# Patient Record
Sex: Female | Born: 1960 | Marital: Married | Smoking: Never smoker
Health system: Southern US, Community
[De-identification: ages and names within clinical notes are randomized; demographics above are authoritative.]

## PROBLEM LIST (undated history)

## (undated) DIAGNOSIS — T8859XA Other complications of anesthesia, initial encounter: Secondary | ICD-10-CM

## (undated) DIAGNOSIS — K219 Gastro-esophageal reflux disease without esophagitis: Secondary | ICD-10-CM

## (undated) DIAGNOSIS — T4145XA Adverse effect of unspecified anesthetic, initial encounter: Secondary | ICD-10-CM

## (undated) DIAGNOSIS — Z87442 Personal history of urinary calculi: Secondary | ICD-10-CM

## (undated) DIAGNOSIS — G4730 Sleep apnea, unspecified: Secondary | ICD-10-CM

---
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
## 1989-09-16 HISTORY — PX: TUBAL LIGATION: SHX77

## 2015-07-06 ENCOUNTER — Other Ambulatory Visit: Payer: Self-pay | Admitting: Primary Care

## 2015-07-06 ENCOUNTER — Ambulatory Visit
Admission: RE | Admit: 2015-07-06 | Discharge: 2015-07-06 | Disposition: A | Discharge status: Home / Self Care | Payer: BLUE CROSS/BLUE SHIELD | Source: Ambulatory Visit | Attending: Primary Care | Admitting: Primary Care

## 2015-07-06 DIAGNOSIS — Z1231 Encounter for screening mammogram for malignant neoplasm of breast: Secondary | ICD-10-CM | POA: Diagnosis not present

## 2016-07-11 ENCOUNTER — Other Ambulatory Visit: Payer: Self-pay | Admitting: Primary Care

## 2016-07-11 DIAGNOSIS — R102 Pelvic and perineal pain: Secondary | ICD-10-CM

## 2016-07-11 DIAGNOSIS — Z0000 Encounter for general adult medical examination without abnormal findings: Secondary | ICD-10-CM

## 2016-07-16 ENCOUNTER — Ambulatory Visit: Payer: BLUE CROSS/BLUE SHIELD

## 2016-07-26 ENCOUNTER — Ambulatory Visit
Admission: RE | Admit: 2016-07-26 | Discharge: 2016-07-26 | Disposition: A | Discharge status: Home / Self Care | Payer: BLUE CROSS/BLUE SHIELD | Source: Ambulatory Visit | Attending: Primary Care | Admitting: Primary Care

## 2016-07-26 DIAGNOSIS — Z780 Asymptomatic menopausal state: Secondary | ICD-10-CM | POA: Diagnosis not present

## 2016-07-26 DIAGNOSIS — R102 Pelvic and perineal pain: Secondary | ICD-10-CM | POA: Insufficient documentation

## 2016-07-26 DIAGNOSIS — Z9851 Tubal ligation status: Secondary | ICD-10-CM | POA: Diagnosis not present

## 2016-08-22 ENCOUNTER — Ambulatory Visit
Admission: RE | Admit: 2016-08-22 | Discharge: 2016-08-22 | Disposition: A | Discharge status: Home / Self Care | Payer: BLUE CROSS/BLUE SHIELD | Source: Ambulatory Visit | Attending: Primary Care | Admitting: Primary Care

## 2016-08-22 DIAGNOSIS — Z1231 Encounter for screening mammogram for malignant neoplasm of breast: Secondary | ICD-10-CM | POA: Insufficient documentation

## 2016-08-22 DIAGNOSIS — Z0000 Encounter for general adult medical examination without abnormal findings: Secondary | ICD-10-CM

## 2019-03-15 ENCOUNTER — Telehealth: Payer: Self-pay | Admitting: *Deleted

## 2019-03-15 DIAGNOSIS — Z20822 Contact with and (suspected) exposure to covid-19: Secondary | ICD-10-CM

## 2019-03-15 NOTE — Telephone Encounter (Signed)
Called pt using interpreter # [phone] to scheduled for covid-19 testing. She is scheduled for tomorrow at the [facility] building in [place] at 11 am. Advised to stay in car with mask on and windows rolled up until time to be tested. Advised that this is a drive thru testing site. She voiced understanding.

## 2019-03-16 ENCOUNTER — Other Ambulatory Visit: Payer: BLUE CROSS/BLUE SHIELD

## 2019-03-16 DIAGNOSIS — Z20822 Contact with and (suspected) exposure to covid-19: Secondary | ICD-10-CM

## 2019-03-20 LAB — NOVEL CORONAVIRUS, NAA: SARS-CoV-2, NAA: NOT DETECTED

## 2019-10-27 ENCOUNTER — Other Ambulatory Visit: Payer: Self-pay

## 2019-10-27 ENCOUNTER — Emergency Department
Admission: EM | Admit: 2019-10-27 | Discharge: 2019-10-27 | Disposition: A | Discharge status: Home / Self Care | Payer: BC Managed Care – PPO | Attending: Emergency Medicine | Admitting: Emergency Medicine

## 2019-10-27 ENCOUNTER — Emergency Department: Payer: BC Managed Care – PPO

## 2019-10-27 DIAGNOSIS — Y939 Activity, unspecified: Secondary | ICD-10-CM | POA: Insufficient documentation

## 2019-10-27 DIAGNOSIS — S59911A Unspecified injury of right forearm, initial encounter: Secondary | ICD-10-CM | POA: Diagnosis present

## 2019-10-27 DIAGNOSIS — S52601A Unspecified fracture of lower end of right ulna, initial encounter for closed fracture: Secondary | ICD-10-CM

## 2019-10-27 DIAGNOSIS — S52611A Displaced fracture of right ulna styloid process, initial encounter for closed fracture: Secondary | ICD-10-CM | POA: Insufficient documentation

## 2019-10-27 DIAGNOSIS — W010XXA Fall on same level from slipping, tripping and stumbling without subsequent striking against object, initial encounter: Secondary | ICD-10-CM | POA: Insufficient documentation

## 2019-10-27 DIAGNOSIS — Y999 Unspecified external cause status: Secondary | ICD-10-CM | POA: Diagnosis not present

## 2019-10-27 DIAGNOSIS — S52591A Other fractures of lower end of right radius, initial encounter for closed fracture: Secondary | ICD-10-CM | POA: Diagnosis not present

## 2019-10-27 DIAGNOSIS — Y92000 Kitchen of unspecified non-institutional (private) residence as  the place of occurrence of the external cause: Secondary | ICD-10-CM | POA: Diagnosis not present

## 2019-10-27 DIAGNOSIS — S52501A Unspecified fracture of the lower end of right radius, initial encounter for closed fracture: Secondary | ICD-10-CM

## 2019-10-27 MED ORDER — HYDROMORPHONE HCL 1 MG/ML IJ SOLN
INTRAMUSCULAR | Status: AC
  Administered 2019-10-27: 1 mg via INTRAVENOUS
  Filled 2019-10-27: qty 1

## 2019-10-27 MED ORDER — ONDANSETRON HCL 4 MG/2ML IJ SOLN
4.0000 mg | Freq: Once | INTRAMUSCULAR | Status: DC
  Filled 2019-10-27: qty 2

## 2019-10-27 MED ORDER — OXYCODONE-ACETAMINOPHEN 5-325 MG PO TABS: 1.0000 | ORAL_TABLET | ORAL | 0 refills | Status: DC | PRN

## 2019-10-27 MED ORDER — HYDROMORPHONE HCL 1 MG/ML IJ SOLN: 1.0000 mg | Freq: Once | INTRAMUSCULAR | Status: AC

## 2019-10-27 MED ORDER — OXYCODONE-ACETAMINOPHEN 5-325 MG PO TABS
1.0000 | ORAL_TABLET | Freq: Once | ORAL | Status: AC
  Administered 2019-10-27: 17:00:00 1 via ORAL
  Filled 2019-10-27: qty 1

## 2019-10-27 MED ORDER — MORPHINE SULFATE (PF) 4 MG/ML IV SOLN: 4.0000 mg | Freq: Once | INTRAVENOUS | Status: DC

## 2019-10-27 NOTE — ED Notes (Signed)
Esignature pad not working at time of discharge; pt expresses verbal understanding of paperwork with no further questions at this time.

## 2019-10-27 NOTE — ED Provider Notes (Signed)
   PROCEDURES  Procedure(s) performed (including Critical Care):  .Ortho Injury Treatment  Date/Time: 10/27/2019 5:37 PM Performed by: Chesley Noon, MD Authorized by: Chesley Noon, MD   Consent:    Consent obtained:  Verbal   Consent given by:  PatientInjury location: wrist Location details: right wrist Injury type: fracture Fracture type: distal radius and ulnar styloid Pre-procedure neurovascular assessment: neurovascularly intact Pre-procedure distal perfusion: normal Pre-procedure neurological function: normal Pre-procedure range of motion: reduced  Anesthesia: Local anesthesia used: no  Patient sedated: NoManipulation performed: yes Skeletal traction used: yes Reduction successful: yes X-ray confirmed reduction: yes Immobilization: splint Splint type: sugar tong Supplies used: cotton padding and Ortho-Glass Post-procedure neurovascular assessment: post-procedure neurovascularly intact Post-procedure distal perfusion: normal Post-procedure neurological function: normal Post-procedure range of motion: unchanged Patient tolerance: patient tolerated the procedure well with no immediate complications       Chesley Noon, MD 10/27/19 1836

## 2019-10-27 NOTE — ED Triage Notes (Addendum)
Pt states she slipped on something at home to day and injured her right wrist. Denies other injury

## 2019-10-27 NOTE — Discharge Instructions (Signed)
Follow-up with Dr. Allena Katz.  Please call for an appointment.   Elevate and ice the wrist.   Do not remove the splint until you have been evaluated by orthopedics.   take the pain medication as needed.  You can also take ibuprofen as needed. Return to the emergency department if worsening

## 2019-10-27 NOTE — ED Provider Notes (Signed)
[facility] Emergency Department Provider Note  ____________________________________________   First MD Initiated Contact with Patient 10/27/19 1551     (approximate)  I have reviewed the triage vital signs and the nursing notes.   HISTORY  Chief Complaint Wrist Pain    HPI Veronica Anthony is a 59 y.o. female presents emergency department with complaints of right wrist pain.  Patient states that she tripped and fell in her kitchen.  No other injuries reported.    History reviewed. No pertinent past medical history.  There are no problems to display for this patient.   History reviewed. No pertinent surgical history.  Prior to Admission medications   Medication Sig Start Date End Date Taking? Authorizing Provider  oxyCODONE-acetaminophen (PERCOCET) 5-325 MG tablet Take 1 tablet by mouth every 4 (four) hours as needed for severe pain. 10/27/19 10/26/20  Faythe Ghee, PA-C    Allergies Tramadol  Family History  Problem Relation Age of Onset  . Breast cancer Neg Hx     Social History Social History   Tobacco Use  . Smoking status: Never Smoker  . Smokeless tobacco: Never Used  Substance Use Topics  . Alcohol use: Not Currently  . Drug use: Not Currently    Review of Systems  Constitutional: No fever/chills Eyes: No visual changes. ENT: No sore throat. Respiratory: Denies cough Cardiovascular: Denies chest pain Gastrointestinal: Denies abdominal pain Genitourinary: Negative for dysuria. Musculoskeletal: Negative for back pain.  Positive for right wrist pain Skin: Negative for rash. Psychiatric: no mood changes,     ____________________________________________   PHYSICAL EXAM:  VITAL SIGNS: ED Triage Vitals  Enc Vitals Group     BP 10/27/19 1540 (!) 124/53     Pulse Rate 10/27/19 1540 74     Resp 10/27/19 1540 18     Temp 10/27/19 1540 97.9 F (36.6 C)     Temp Source 10/27/19 1540 Oral     SpO2 10/27/19 1540 97 %       Weight 10/27/19 1536 150 lb (68 kg)     Height 10/27/19 1536 5\' 6"  (1.676 m)     Head Circumference --      Peak Flow --      Pain Score 10/27/19 1536 10     Pain Loc --      Pain Edu? --      Excl. in GC? --     Constitutional: Alert and oriented. Well appearing and in no acute distress. Eyes: Conjunctivae are normal.  Head: Atraumatic. Nose: No congestion/rhinnorhea. Mouth/Throat: Mucous membranes are moist.   Neck:  supple no lymphadenopathy noted Cardiovascular: Normal rate, regular rhythm. Respiratory: Normal respiratory effort.  No retractions,  GU: deferred Musculoskeletal: Decreased range of motion of the right wrist, positive deformity noted distally, neurovascular is intact  neurologic:  Normal speech and language.  Skin:  Skin is warm, dry and intact. No rash noted. Psychiatric: Mood and affect are normal. Speech and behavior are normal.  ____________________________________________   LABS (all labs ordered are listed, but only abnormal results are displayed)  Labs Reviewed - No data to display ____________________________________________   ____________________________________________  RADIOLOGY  X-ray of the right wrist shows a distal radial fracture along with a small fracture of the distal ulna  ____________________________________________   PROCEDURES  Procedure(s) performed: Reduction performed by Dr. 12/25/19, sugar tong OCL applied, sling applied  Procedures    ____________________________________________   INITIAL IMPRESSION / ASSESSMENT AND PLAN / ED COURSE  Pertinent labs &  imaging results that were available during my care of the patient were reviewed by me and considered in my medical decision making (see chart for details).   The patient is a 59 year old female presents emergency department after a fall in her kitchen.  Complaining of right wrist pain.  Physical exam shows patient to appear well.  Positive deformity noted at the  distal radius.  Neurovascular is intact.  Remainder of exam is unremarkable  X-ray of the right wrist shows a distal radial fracture which is angulated and displaced, distal ulna has a small fracture noted. X-ray of the forearm shows the same fracture no proximal fracture noted  Dr. Posey Pronto was notified.  He states to reduce and splint.  Follow-up in his office.  Explained findings to the patient.  I did place the arm on a pillow with towels to stabilize it, ice pack was applied.  Percocet 1 p.o. was ordered.  Dilaudid 1 mg IV, Zofran 4 mg IV prior to reduction.  Dr. Charna Archer in to reduce the fracture.  She was placed in a sugar tong OCL after reduction..  She is neurovascularly intact post splint application.  All instructions were discussed with the patient via the interpreter.  She was discharged in stable condition.   Veronica Anthony was evaluated in Emergency Department on 10/27/2019 for the symptoms described in the history of present illness. She was evaluated in the context of the global COVID-19 pandemic, which necessitated consideration that the patient might be at risk for infection with the SARS-CoV-2 virus that causes COVID-19. Institutional protocols and algorithms that pertain to the evaluation of patients at risk for COVID-19 are in a state of rapid change based on information released by regulatory bodies including the CDC and federal and state organizations. These policies and algorithms were followed during the patient's care in the ED.   As part of my medical decision making, I reviewed the following data within the [place] notes reviewed and incorporated, Interpreter needed, Old chart reviewed, Radiograph reviewed see above, A consult was requested and obtained from this/these consultant(s) Orthopedics, Evaluated by EM attending Dr. Charna Archer, Notes from prior ED visits and [place] Controlled Substance  Database  ____________________________________________   FINAL CLINICAL IMPRESSION(S) / ED DIAGNOSES  Final diagnoses:  Closed fracture of distal end of right radius, unspecified fracture morphology, initial encounter  Closed fracture of distal end of right ulna, unspecified fracture morphology, initial encounter      NEW MEDICATIONS STARTED DURING THIS VISIT:  New Prescriptions   OXYCODONE-ACETAMINOPHEN (PERCOCET) 5-325 MG TABLET    Take 1 tablet by mouth every 4 (four) hours as needed for severe pain.     Note:  This document was prepared using Dragon voice recognition software and may include unintentional dictation errors.    Versie Starks, PA-C 10/27/19 1746    Blake Divine, MD 10/27/19 Bosie Helper

## 2019-11-02 ENCOUNTER — Encounter
Admission: RE | Admit: 2019-11-02 | Discharge: 2019-11-02 | Disposition: A | Discharge status: Home / Self Care | Payer: BC Managed Care – PPO | Source: Ambulatory Visit | Attending: Orthopedic Surgery | Admitting: Orthopedic Surgery

## 2019-11-02 ENCOUNTER — Other Ambulatory Visit: Payer: Self-pay | Admitting: Orthopedic Surgery

## 2019-11-02 DIAGNOSIS — Z01818 Encounter for other preprocedural examination: Secondary | ICD-10-CM | POA: Diagnosis not present

## 2019-11-02 HISTORY — DX: Gastro-esophageal reflux disease without esophagitis: K21.9

## 2019-11-02 HISTORY — DX: Sleep apnea, unspecified: G47.30

## 2019-11-02 HISTORY — DX: Other complications of anesthesia, initial encounter: T88.59XA

## 2019-11-02 HISTORY — DX: Personal history of urinary calculi: Z87.442

## 2019-11-02 NOTE — Patient Instructions (Addendum)
Your procedure is scheduled on: Thursday 11/04/19 Su procedimiento est programado para: Veronica Anthony 11/04/19 Report to [place] [facility]: [facility]  To find out your arrival time please call [phone] between 1PM - 3PM on. Wednesday 11/03/19 Para saber su hora de llegada por favor llame al [phone] entre la 1PM - 3PM el da: M[place]col[place] 11/03/19   Remember: Instructions that are not followed completely may result in serious medical risk, up to and including death,  or upon the discretion of your surgeon and anesthesiologist your surgery may need to be rescheduled.  Recuerde: Las instrucciones que no se siguen completamente Heritage manager en un riesgo de salud grave, incluyendo hasta  la [place] o a discrecin de su cirujano y Environmental health practitioner, su ciruga se puede posponer.   __X_ 1.Do not eat food after midnight the night before your procedure. No    gum chewing or hard candies. You may drink clear liquids up to 2 hours     before you are scheduled to arrive for your surgery- DO not drink clear     Liquids within 2 hours of the start of your surgery.     Clear Liquids include:    water, apple juice without pulp, clear carbohydrate drink such as    Clearfast of [place], Black Coffee or Tea (Do not add anything to coffee or tea).     __X__ No coma nada despus de la medianoche de la noche anterior a su    procedimiento. No coma chicles ni caramelos duros. Puede tomar    lquidos claros hasta 2 horas antes de su hora programada de llegada al     hospital para su procedimiento. No tome lquidos claros durante el     transcurso de las 2 horas de su llegada programada al hospital para su     procedimiento, ya que esto puede llevar a que su procedimiento se    retrase o tenga que volver a Health and safety inspector.  Los lquidos claros incluyen:          - Agua o jugo de [place] sin pulpa          - Bebidas claras con carbohidratos como ClearFast o Gatorade          - Caf negro o t claro (sin  leche, sin cremas, no agregue nada al caf ni al t)  No tome nada que no est en esta lista.  Los pacientes con diabetes tipo 1 y tipo 2 solo deben Agricultural engineer.  Llame a la clnica de PreCare o a la unidad de Same Day Surgery si  tiene alguna pregunta sobre estas instrucciones.              ___ 2.Do Not Smoke or use e-cigarettes For 24 Hours Prior to Your Surgery.    Do not use any chewable tobacco products for at least 6   hours prior to surgery.    No fume ni use cigarrillos electrnicos durante las 24 horas previas    a su [place].  No use ningn producto de tabaco masticable durante   al menos 6 horas antes de la ciruga.     ___ 3. No alcohol for 24 hours before or after surgery.    No tome alcohol durante las 24 horas antes ni despus de la [place].    __x__ 5. Notify your doctor if there is any change in your medical condition (cold,fever, infections).    Informe a su mdico si hay algn cambio en su  condicin mdica  (resfriado, fiebre, infecciones).   Do not wear jewelry, make-up, hairpins, clips or nail polish.  No use joyas, maquillajes, pinzas/ganchos para el cabello ni esmalte de uas.  Do not wear lotions, powders, or perfumes. You may wear deodorant.  No use lociones, polvos o perfumes.  Puede usar desodorante.    Do not shave 48 hours prior to surgery. Men may shave face and neck.  No se afeite 48 horas antes de la [place].  Los hombres pueden [employer] cara  y el cuello.   Do not bring valuables to the hospital.   No lleve objetos de valor al hospital.  [facility] is not responsible for any belongings or valuables.  [facility] no se hace responsable de ningn tipo de pertenencias u objetos de Licensed conveyancer.               Contacts, dentures or bridgework may not be worn into surgery.  Los lentes de [place], las dentaduras postizas o puentes no se pueden usar en la [place].     Patients discharged the day of surgery will not be allowed to drive home. A los  pacientes que se les da de alta el mismo da de la ciruga no se les permitir conducir a Higher education careers adviser.      __x__ Take these medicines the morning of surgery with A SIP OF WATER:           Tome estas medicinas la maana de la ciruga con UN SORBO DE AGUA:  1.esomeprazole (NEXIUM)       __x__ Use CHG Soap as directed          Utilice el jabn de CHG segn lo indicado la noche antes y el dia de la [place]      __x__ Stop Anti-inflammatories today ibuprofen (ADVIL)           Deje de tomar antiinflamatorios el da: hoy ibuprofen (ADVIL)    __x__ Stop supplements until after surgery            Deje de tomar suplementos hasta despus de la ciruga.    __x__ Do not start new supplements before your surgery            No empieze suplementos nuevos antes de [place]

## 2019-11-03 ENCOUNTER — Other Ambulatory Visit: Payer: Self-pay

## 2019-11-03 ENCOUNTER — Other Ambulatory Visit
Admission: RE | Admit: 2019-11-03 | Discharge: 2019-11-03 | Disposition: A | Discharge status: Home / Self Care | Payer: BC Managed Care – PPO | Source: Ambulatory Visit | Attending: Orthopedic Surgery | Admitting: Orthopedic Surgery

## 2019-11-03 DIAGNOSIS — Z20822 Contact with and (suspected) exposure to covid-19: Secondary | ICD-10-CM | POA: Insufficient documentation

## 2019-11-03 DIAGNOSIS — Z01812 Encounter for preprocedural laboratory examination: Secondary | ICD-10-CM | POA: Insufficient documentation

## 2019-11-03 LAB — SARS CORONAVIRUS 2 (TAT 6-24 HRS): SARS Coronavirus 2: NEGATIVE

## 2019-11-04 ENCOUNTER — Encounter: Payer: Self-pay | Admitting: Orthopedic Surgery

## 2019-11-04 ENCOUNTER — Ambulatory Visit: Payer: BC Managed Care – PPO

## 2019-11-04 ENCOUNTER — Ambulatory Visit
Admission: RE | Admit: 2019-11-04 | Discharge: 2019-11-04 | Disposition: A | Discharge status: Home / Self Care | Payer: BC Managed Care – PPO | Attending: Orthopedic Surgery | Admitting: Orthopedic Surgery

## 2019-11-04 ENCOUNTER — Encounter
Admission: RE | Disposition: A | Discharge status: Home / Self Care | Payer: Self-pay | Source: Home / Self Care | Attending: Orthopedic Surgery

## 2019-11-04 ENCOUNTER — Ambulatory Visit: Payer: BC Managed Care – PPO | Admitting: Anesthesiology

## 2019-11-04 ENCOUNTER — Other Ambulatory Visit: Payer: Self-pay

## 2019-11-04 DIAGNOSIS — K219 Gastro-esophageal reflux disease without esophagitis: Secondary | ICD-10-CM | POA: Insufficient documentation

## 2019-11-04 DIAGNOSIS — Z8781 Personal history of (healed) traumatic fracture: Secondary | ICD-10-CM

## 2019-11-04 DIAGNOSIS — X58XXXD Exposure to other specified factors, subsequent encounter: Secondary | ICD-10-CM | POA: Diagnosis not present

## 2019-11-04 DIAGNOSIS — S52571D Other intraarticular fracture of lower end of right radius, subsequent encounter for closed fracture with routine healing: Secondary | ICD-10-CM | POA: Insufficient documentation

## 2019-11-04 DIAGNOSIS — Z9889 Other specified postprocedural states: Secondary | ICD-10-CM

## 2019-11-04 HISTORY — PX: OPEN REDUCTION INTERNAL FIXATION (ORIF) DISTAL RADIAL FRACTURE: SHX5989

## 2019-11-04 SURGERY — OPEN REDUCTION INTERNAL FIXATION (ORIF) DISTAL RADIUS FRACTURE
Anesthesia: General | Laterality: Right

## 2019-11-04 MED ORDER — FENTANYL CITRATE (PF) 100 MCG/2ML IJ SOLN
INTRAMUSCULAR | Status: AC
  Administered 2019-11-04: 15:00:00 25 ug via INTRAVENOUS
  Filled 2019-11-04: qty 2

## 2019-11-04 MED ORDER — FAMOTIDINE 20 MG PO TABS: 20.0000 mg | ORAL_TABLET | Freq: Once | ORAL | Status: AC

## 2019-11-04 MED ORDER — FENTANYL CITRATE (PF) 100 MCG/2ML IJ SOLN
25.0000 ug | INTRAMUSCULAR | Status: DC | PRN
  Administered 2019-11-04 (×4): 25 ug via INTRAVENOUS

## 2019-11-04 MED ORDER — FENTANYL CITRATE (PF) 100 MCG/2ML IJ SOLN
INTRAMUSCULAR | Status: DC | PRN
  Administered 2019-11-04 (×2): 50 ug via INTRAVENOUS

## 2019-11-04 MED ORDER — HYDROMORPHONE HCL 1 MG/ML IJ SOLN
INTRAMUSCULAR | Status: AC
  Administered 2019-11-04: 0.5 mg via INTRAVENOUS
  Filled 2019-11-04: qty 1

## 2019-11-04 MED ORDER — MIDAZOLAM HCL 2 MG/2ML IJ SOLN
INTRAMUSCULAR | Status: DC | PRN
  Administered 2019-11-04: 2 mg via INTRAVENOUS

## 2019-11-04 MED ORDER — METOCLOPRAMIDE HCL 5 MG/ML IJ SOLN: 5.0000 mg | Freq: Three times a day (TID) | INTRAMUSCULAR | Status: DC | PRN

## 2019-11-04 MED ORDER — CEFAZOLIN SODIUM-DEXTROSE 2-4 GM/100ML-% IV SOLN
INTRAVENOUS | Status: AC
  Filled 2019-11-04: qty 100

## 2019-11-04 MED ORDER — NEOMYCIN-POLYMYXIN B GU 40-200000 IR SOLN
Status: AC
  Filled 2019-11-04: qty 20

## 2019-11-04 MED ORDER — MIDAZOLAM HCL 2 MG/2ML IJ SOLN
INTRAMUSCULAR | Status: AC
  Filled 2019-11-04: qty 2

## 2019-11-04 MED ORDER — ONDANSETRON HCL 4 MG/2ML IJ SOLN: 4.0000 mg | Freq: Four times a day (QID) | INTRAMUSCULAR | Status: DC | PRN

## 2019-11-04 MED ORDER — NEOMYCIN-POLYMYXIN B GU 40-200000 IR SOLN
Status: DC | PRN
  Administered 2019-11-04: 4 mL

## 2019-11-04 MED ORDER — FENTANYL CITRATE (PF) 100 MCG/2ML IJ SOLN
INTRAMUSCULAR | Status: AC
  Filled 2019-11-04: qty 2

## 2019-11-04 MED ORDER — CHLORHEXIDINE GLUCONATE 4 % EX LIQD: 60.0000 mL | Freq: Once | CUTANEOUS | Status: DC

## 2019-11-04 MED ORDER — FAMOTIDINE 20 MG PO TABS
ORAL_TABLET | ORAL | Status: AC
  Administered 2019-11-04: 20 mg via ORAL
  Filled 2019-11-04: qty 1

## 2019-11-04 MED ORDER — PHENYLEPHRINE HCL (PRESSORS) 10 MG/ML IV SOLN
INTRAVENOUS | Status: DC | PRN
  Administered 2019-11-04: 100 ug via INTRAVENOUS

## 2019-11-04 MED ORDER — LIDOCAINE HCL (CARDIAC) PF 100 MG/5ML IV SOSY
PREFILLED_SYRINGE | INTRAVENOUS | Status: DC | PRN
  Administered 2019-11-04: 100 mg via INTRAVENOUS

## 2019-11-04 MED ORDER — KETOROLAC TROMETHAMINE 30 MG/ML IJ SOLN: 30.0000 mg | Freq: Once | INTRAMUSCULAR | Status: AC

## 2019-11-04 MED ORDER — SCOPOLAMINE 1 MG/3DAYS TD PT72
MEDICATED_PATCH | TRANSDERMAL | Status: AC
  Administered 2019-11-04: 13:00:00 1.5 mg via TRANSDERMAL
  Filled 2019-11-04: qty 1

## 2019-11-04 MED ORDER — ONDANSETRON HCL 4 MG/2ML IJ SOLN: 4.0000 mg | Freq: Once | INTRAMUSCULAR | Status: DC | PRN

## 2019-11-04 MED ORDER — CEFAZOLIN SODIUM-DEXTROSE 2-4 GM/100ML-% IV SOLN
2.0000 g | INTRAVENOUS | Status: AC
  Administered 2019-11-04: 2 g via INTRAVENOUS

## 2019-11-04 MED ORDER — LACTATED RINGERS IV SOLN: INTRAVENOUS | Status: DC

## 2019-11-04 MED ORDER — ACETAMINOPHEN 10 MG/ML IV SOLN: 1000.0000 mg | Freq: Once | INTRAVENOUS | Status: AC

## 2019-11-04 MED ORDER — SCOPOLAMINE 1 MG/3DAYS TD PT72: 1.0000 | MEDICATED_PATCH | TRANSDERMAL | Status: DC

## 2019-11-04 MED ORDER — ONDANSETRON HCL 4 MG/2ML IJ SOLN
INTRAMUSCULAR | Status: DC | PRN
  Administered 2019-11-04: 4 mg via INTRAVENOUS

## 2019-11-04 MED ORDER — PROPOFOL 10 MG/ML IV BOLUS
INTRAVENOUS | Status: DC | PRN
  Administered 2019-11-04: 130 mg via INTRAVENOUS

## 2019-11-04 MED ORDER — HYDROCODONE-ACETAMINOPHEN 5-325 MG PO TABS
ORAL_TABLET | ORAL | Status: AC
  Administered 2019-11-04: 1
  Filled 2019-11-04: qty 1

## 2019-11-04 MED ORDER — HYDROCODONE-ACETAMINOPHEN 5-325 MG PO TABS: 1.0000 | ORAL_TABLET | Freq: Four times a day (QID) | ORAL | 0 refills | Status: DC | PRN

## 2019-11-04 MED ORDER — ONDANSETRON HCL 4 MG PO TABS: 4.0000 mg | ORAL_TABLET | Freq: Four times a day (QID) | ORAL | Status: DC | PRN

## 2019-11-04 MED ORDER — METOCLOPRAMIDE HCL 10 MG PO TABS: 5.0000 mg | ORAL_TABLET | Freq: Three times a day (TID) | ORAL | Status: DC | PRN

## 2019-11-04 MED ORDER — HYDROMORPHONE HCL 1 MG/ML IJ SOLN
0.5000 mg | INTRAMUSCULAR | Status: DC | PRN
  Administered 2019-11-04: 0.5 mg via INTRAVENOUS

## 2019-11-04 MED ORDER — KETOROLAC TROMETHAMINE 30 MG/ML IJ SOLN
INTRAMUSCULAR | Status: AC
  Administered 2019-11-04: 15:00:00 30 mg via INTRAVENOUS
  Filled 2019-11-04: qty 1

## 2019-11-04 MED ORDER — ACETAMINOPHEN 10 MG/ML IV SOLN
INTRAVENOUS | Status: AC
  Administered 2019-11-04: 1000 mg via INTRAVENOUS
  Filled 2019-11-04: qty 100

## 2019-11-04 MED ORDER — DEXAMETHASONE SODIUM PHOSPHATE 10 MG/ML IJ SOLN
INTRAMUSCULAR | Status: DC | PRN
  Administered 2019-11-04: 10 mg via INTRAVENOUS

## 2019-11-04 MED ORDER — SODIUM CHLORIDE 0.9 % IV SOLN: INTRAVENOUS | Status: DC

## 2019-11-04 MED ORDER — HYDROCODONE-ACETAMINOPHEN 7.5-325 MG PO TABS
1.0000 | ORAL_TABLET | Freq: Once | ORAL | Status: DC
  Filled 2019-11-04: qty 1

## 2019-11-04 MED ORDER — FENTANYL CITRATE (PF) 100 MCG/2ML IJ SOLN
INTRAMUSCULAR | Status: AC
  Administered 2019-11-04: 25 ug via INTRAVENOUS
  Filled 2019-11-04: qty 2

## 2019-11-04 SURGICAL SUPPLY — 37 items
BNDG ELASTIC 4X5.8 VLCR STR LF (GAUZE/BANDAGES/DRESSINGS) ×3 IMPLANT
CANISTER SUCT 1200ML W/VALVE (MISCELLANEOUS) ×3 IMPLANT
CHLORAPREP W/TINT 26 (MISCELLANEOUS) ×3 IMPLANT
COVER WAND RF STERILE (DRAPES) ×3 IMPLANT
CUFF TOURN SGL QUICK 18X4 (TOURNIQUET CUFF) ×2 IMPLANT
DRAPE FLUOR MINI C-ARM 54X84 (DRAPES) ×3 IMPLANT
ELECT REM PT RETURN 9FT ADLT (ELECTROSURGICAL) ×3
ELECTRODE REM PT RTRN 9FT ADLT (ELECTROSURGICAL) ×1 IMPLANT
GAUZE SPONGE 4X4 12PLY STRL (GAUZE/BANDAGES/DRESSINGS) ×3 IMPLANT
GAUZE XEROFORM 1X8 LF (GAUZE/BANDAGES/DRESSINGS) ×6 IMPLANT
GLOVE SURG SYN 9.0  PF PI (GLOVE) ×2
GLOVE SURG SYN 9.0 PF PI (GLOVE) ×1 IMPLANT
GOWN SRG 2XL LVL 4 RGLN SLV (GOWNS) ×1 IMPLANT
GOWN STRL NON-REIN 2XL LVL4 (GOWNS) ×2
GOWN STRL REUS W/ TWL LRG LVL3 (GOWN DISPOSABLE) ×1 IMPLANT
GOWN STRL REUS W/TWL LRG LVL3 (GOWN DISPOSABLE) ×2
KIT TURNOVER KIT A (KITS) ×3 IMPLANT
NDL FILTER BLUNT 18X1 1/2 (NEEDLE) ×1 IMPLANT
NEEDLE FILTER BLUNT 18X 1/2SAF (NEEDLE) ×2
NEEDLE FILTER BLUNT 18X1 1/2 (NEEDLE) ×1 IMPLANT
NS IRRIG 500ML POUR BTL (IV SOLUTION) ×3 IMPLANT
PACK EXTREMITY ARMC (MISCELLANEOUS) ×3 IMPLANT
PAD CAST CTTN 4X4 STRL (SOFTGOODS) ×2 IMPLANT
PADDING CAST COTTON 4X4 STRL (SOFTGOODS) ×4
PEG SUBCHONDRAL SMOOTH 2.0X14 (Peg) ×2 IMPLANT
PEG SUBCHONDRAL SMOOTH 2.0X16 (Peg) ×4 IMPLANT
PEG SUBCHONDRAL SMOOTH 2.0X18 (Peg) ×4 IMPLANT
PEG SUBCHONDRAL SMOOTH 2.0X20 (Peg) ×2 IMPLANT
PLATE SHORT 21.6X48.9 NRRW RT (Plate) ×2 IMPLANT
SCALPEL PROTECTED #15 DISP (BLADE) ×6 IMPLANT
SCREW CORT 3.5X10 LNG (Screw) ×6 IMPLANT
SPLINT CAST 1 STEP 3X12 (MISCELLANEOUS) ×3 IMPLANT
SUT ETHILON 4-0 (SUTURE) ×2
SUT ETHILON 4-0 FS2 18XMFL BLK (SUTURE) ×1
SUT VICRYL 3-0 27IN (SUTURE) ×3 IMPLANT
SUTURE ETHLN 4-0 FS2 18XMF BLK (SUTURE) ×1 IMPLANT
SYR 3ML LL SCALE MARK (SYRINGE) ×3 IMPLANT

## 2019-11-04 NOTE — Anesthesia Preprocedure Evaluation (Signed)
Anesthesia Evaluation  Patient identified by MRN, date of birth, ID band Patient awake    Reviewed: Allergy & Precautions, NPO status , Patient's Chart, lab work & pertinent test results  History of Anesthesia Complications (+) PONV and history of anesthetic complications  Airway Mallampati: II       Dental   Pulmonary neg sleep apnea, neg COPD, Not current smoker,           Cardiovascular (-) hypertension(-) Past MI and (-) CHF (-) dysrhythmias (-) Valvular Problems/Murmurs     Neuro/Psych neg Seizures    GI/Hepatic Neg liver ROS, GERD  ,  Endo/Other  neg diabetes  Renal/GU negative Renal ROS     Musculoskeletal   Abdominal   Peds  Hematology   Anesthesia Other Findings   Reproductive/Obstetrics                             Anesthesia Physical Anesthesia Plan  ASA: II  Anesthesia Plan: General   Post-op Pain Management:    Induction: Intravenous  PONV Risk Score and Plan: 4 or greater and Ondansetron, Dexamethasone and Midazolam  Airway Management Planned: LMA  Additional Equipment:   Intra-op Plan:   Post-operative Plan:   Informed Consent: I have reviewed the patients History and Physical, chart, labs and discussed the procedure including the risks, benefits and alternatives for the proposed anesthesia with the patient or authorized representative who has indicated his/her understanding and acceptance.       Plan Discussed with:   Anesthesia Plan Comments:         Anesthesia Quick Evaluation

## 2019-11-04 NOTE — H&P (Signed)
Reviewed paper H+P, will be scanned into chart. No changes noted.  

## 2019-11-04 NOTE — Op Note (Signed)
11/04/2019  3:03 PM  PATIENT:  Veronica Anthony  59 y.o. female  PRE-OPERATIVE DIAGNOSIS:  Right Distal Radius Fracture comminuted intra-articular 3 distal fragments  POST-OPERATIVE DIAGNOSIS:  Right Distal Radius Fracture same  PROCEDURE:  Procedure(s): OPEN REDUCTION INTERNAL FIXATION (ORIF) DISTAL RADIAL FRACTURE (Right)  SURGEON: Leitha Schuller, MD  ASSISTANTS: None  ANESTHESIA:   general  EBL:  Total I/O In: 400 [I.V.:400] Out: 2 [Blood:2]  BLOOD ADMINISTERED:none  DRAINS: none   LOCAL MEDICATIONS USED:  NONE  SPECIMEN:  No Specimen  DISPOSITION OF SPECIMEN:  N/A  COUNTS:  YES  TOURNIQUET:   Total Tourniquet Time Documented: Upper Arm (Right) - 17 minutes Total: Upper Arm (Right) - 17 minutes   IMPLANTS: Hand innovations DVR short narrow right with multiple smooth pegs and screws  DICTATION: .Dragon Dictation patient brought the operating and after general anesthesia was obtained right arm was prepped and draped in usual sterile fashion.  Appropriate patient identification and timeout procedures were completed.  Tourniquet raised and fingertrap traction applied to the arm with weightbearing with the end of the table.  A volar approach was made over the center of the FCR tendon tendon sheath incised the tendon retracted radially off the radial artery and associated veins.  The pronator was partially removed already back from both proximal distal fragments and this was elevated off the radial border exposing the fracture.  With traction applied length was restored and a Therapist, nutritional was used to get the more ulnar fragment and anatomic position.  Distal first approach was done performing placing the plate in appropriate position pinning in place and checking] lateral projections.  Distal peg holes were then all filled using standard technique drilling measuring off the drill guide and placing smooth pegs.  3 cortical screws were placed and length along with radial  inclination and volar tilt were restored.  Traction was removed and with motion the fracture was stable.  The wound was thoroughly irrigated and tourniquet let down.  The wound was closed with 3-0 Vicryl subcutaneously and 4-0 nylon for the skin followed by Xeroform 4 x 4 web roll volar splint and Ace wrap   PLAN OF CARE: Discharge to home after PACU  PATIENT DISPOSITION:  PACU - hemodynamically stable.

## 2019-11-04 NOTE — Discharge Instructions (Addendum)
AMBULATORY SURGERY  DISCHARGE INSTRUCTIONS   1) The drugs that you were given will stay in your system until tomorrow so for the next 24 hours you should not:  A) Drive an automobile B) Make any legal decisions C) Drink any alcoholic beverage   2) You may resume regular meals tomorrow.  Today it is better to start with liquids and gradually work up to solid foods.  You may eat anything you prefer, but it is better to start with liquids, then soup and crackers, and gradually work up to solid foods.   3) Please notify your doctor immediately if you have any unusual bleeding, trouble breathing, redness and pain at the surgery site, drainage, fever, or pain not relieved by medication.    4) Additional Instructions:        Please contact your physician with any problems or Same Day Surgery at [phone], Monday through Friday 6 am to 4 pm, or [place] at [facility] number at [phone].Keep arm elevated as much as possible.  Work on finger motion.  Ice to the back of the wrist.  Pain medicine as directed. Fractura de Colles Colles Fracture  La fractura de Colles es un tipo de fractura de la [place]. Significa que el radio est fracturado o fisurado cerca de la articulacin de la [employer]. El radio es uno de [address] del Product manager. Est del mismo lado del pulgar. El otro hueso del antebrazo se denomina cbito. Con frecuencia, cuando una persona tiene una fractura de Colles, el cbito tambin se fractura. A medida que esta lesin se [place], se [place] una frula o un yeso para evitar que el hueso lesionado se mueva (para mantenerlo inmovilizado). Cules son las causas? Las causas frecuentes de este tipo de fractura incluyen lo siguiente:  Un golpe fuerte y Engineering geologist en la [place].  Los accidentes, como un accidente automovilstico o una cada sobre una mano extendida. Qu incrementa el riesgo? Puede correr un riesgo mayor de tener este tipo de fractura si:  Practica deportes  de contacto o de alto riesgo, como esqu, ciclismo y patinaje sobre hielo.  Fuma.  Toma ms de tres bebidas alcohlicas por da.  Presenta baja densidad sea o disminucin de la densidad sea (osteoporosis u osteopenia).  Es un nio pequeo o un adulto mayor.  Es una mujer menopusica.  Tiene antecedentes de fracturas seas previas.  No ingiere suficiente (tiene una deficiencia de) calcio o vitamina D. Cules son los signos o los sntomas? Los sntomas de una fractura de Colles pueden incluir:  Dolor a la palpacin, hematomas e hinchazn en el lugar de la fractura, que generalmente se encuentra cerca de la [place].  La mueca colgando en una posicin extraa o con un aspecto deforme (deformada).  Dificultad para mover la [place]. Cmo se diagnostica? Esta afeccin se puede diagnosticar en funcin de lo siguiente:  Un examen fsico.  Una radiografa del antebrazo.  Los sntomas y antecedentes mdicos. Cmo se trata? El tratamiento depende de muchos factores, por ejemplo, la edad, el nivel de [place] y la gravedad de Printmaker. El tratamiento puede incluir lo siguiente:  Immobilizar la [place] con una frula o un yeso durante varias semanas. Antes de colocar una frula o un yeso en el brazo, el mdico puede mover y Programme researcher, broadcasting/film/video a Scientist, product/process development hueso o los huesos fracturados en su lugar (realineacin).  [place], si el hueso est completamente fuera de lugar (desplazado). Se pueden usar clavos de metal u otros dispositivos para ayudar a  sostener el [employer] en su lugar mientras se [place]. Despus de la [place], se coloca una frula o un yeso en el brazo.  Fisioterapia. Siga estas indicaciones en su casa: Si tiene una frula:  Use la frula como se lo haya indicado el mdico. Qutesela solamente como se lo haya indicado el mdico.  Afloje la frula si los dedos se le entumecen, siente hormigueo o se le enfran y se tornan de Optician, dispensing.  Mantenga la frula limpia.  Si la frula  no es impermeable: ? No deje que se moje. ? Cbrala con un envoltorio hermtico cuando tome un bao de inmersin o [place]. Si tiene un yeso:  No introduzca nada dentro del yeso para rascarse la piel. Esto puede aumentar el riesgo de contraer una infeccin.  [place] piel de alrededor del yeso. Informe al mdico acerca de cualquier inquietud.  Puede aplicar una locin en la piel seca alrededor de los bordes del yeso. No aplique locin en la piel por debajo del yeso.  Mantenga el yeso limpio.  Si el yeso no es impermeable: ? No deje que se moje. ? Cbralo con un envoltorio hermtico cuando tome un bao de inmersin o [place]. Control del dolor, la rigidez y la hinchazn   Si se lo indican, aplique hielo sobre la zona de la lesin: ? Si tiene una frula desmontable, qutesela como se lo haya indicado el mdico. ? Ponga el hielo en una bolsa plstica. ? Coloque una [employer] piel y la bolsa de hielo o entre el yeso y Therapist, nutritional. ? Coloque el hielo durante 3minutos, 2 a 3veces por da.  Mueva los dedos con frecuencia para evitar la rigidez y Armed forces technical officer hinchazn.  Cuando est sentado o acostado, levante (eleve) la mueca por encima del nivel del corazn. Conducir  No conduzca ni use maquinaria pesada mientras toma analgsicos recetados.  Pregunte al mdico si es seguro conducir si tiene una frula o un yeso en el brazo. Actividad  No levante ningn objeto que pese ms de 10libras (4,5kg) o el lmite de peso que le hayan indicado, hasta que el mdico le diga que puede [place].  No use el brazo para apoyar el peso del cuerpo hasta que el mdico lo autorice.  Retome sus actividades normales como se lo haya indicado el mdico. Pregntele al mdico qu actividades son seguras para usted.  Si le indicaron fisioterapia, haga los ejercicios como se lo haya indicado el mdico. Instrucciones generales  No ejerza presin en ninguna parte de la frula o  del yeso hasta que se haya endurecido por completo. Esto puede tardar varias horas.  No consuma ningn producto que contenga nicotina o tabaco, como cigarrillos y Psychologist, sport and exercise. Estos pueden retrasar la consolidacin del [place]. Si necesita ayuda para dejar de fumar, consulte al [employer].  [employer] de venta libre y los recetados solamente como se lo haya indicado el mdico.  Consulting civil engineer a todas las visitas de control como se lo haya indicado el mdico. Esto es importante. Comunquese con un mdico si:  La frula o el yeso: ? Se mojan. ? Se daan. ? De repente se siente demasiado ajustado.  Tiene los siguientes sntomas: ? Cristy Hilts o escalofros. ? Dolor que no se alivia con medicamentos. ? Hinchazn que empeora. Solicite ayuda de inmediato si:  [place] uas de las manos: ? Se tornan de color azulado o gris. ? Se sienten fras o adormecidas.  Tiene adormecimiento  u hormigueos en los dedos, incluso despus de aflojar la frula (si corresponde). Resumen  La fractura de Colles es un tipo de fractura de la [place]. A menudo incluye ambos huesos del Alen Bleacher (radio y cbito).  Las fracturas son frecuentes en las personas jvenes y Designer, television/film set que realizan actividades que demandan [place]. Tambin son frecuentes en las personas mayores que estn en riesgo de sufrir osteoporosis.  Esta lesin se diagnostica mediante un examen fsico y radiografas.  La mueca deber [employer] en su lugar (inmovilizada) con [place] Comment o un yeso durante varias semanas. Puede necesitar una ciruga para una fractura ms grave. Esta informacin no tiene Theme park manager el consejo del mdico. Asegrese de hacerle al mdico cualquier pregunta que tenga. Document Revised: 09/26/2017 Document Reviewed: 09/26/2017 Elsevier Patient Education  2020 [employer].

## 2019-11-04 NOTE — Anesthesia Procedure Notes (Signed)
Procedure Name: LMA Insertion Date/Time: 11/04/2019 2:12 PM Performed by: Junious Silk, CRNA Pre-anesthesia Checklist: Patient identified, Patient being monitored, Timeout performed, Emergency Drugs available and Suction available Patient Re-evaluated:Patient Re-evaluated prior to induction Oxygen Delivery Method: Circle system utilized Preoxygenation: Pre-oxygenation with 100% oxygen Induction Type: IV induction Ventilation: Mask ventilation without difficulty LMA: LMA inserted LMA Size: 4.0 Tube type: Oral Number of attempts: 1 Placement Confirmation: positive ETCO2 and breath sounds checked- equal and bilateral Tube secured with: Tape Dental Injury: Teeth and Oropharynx as per pre-operative assessment

## 2019-11-04 NOTE — Anesthesia Postprocedure Evaluation (Signed)
Anesthesia Post Note  Patient: Veronica Anthony  Procedure(s) Performed: OPEN REDUCTION INTERNAL FIXATION (ORIF) DISTAL RADIAL FRACTURE (Right )  Patient location during evaluation: PACU Anesthesia Type: General Level of consciousness: awake and alert Pain management: pain level controlled Vital Signs Assessment: post-procedure vital signs reviewed and stable Respiratory status: spontaneous breathing and respiratory function stable Cardiovascular status: stable Anesthetic complications: no     Last Vitals:  Vitals:   11/04/19 1456 11/04/19 1511  BP: 127/73 (!) 115/47  Pulse: 81 72  Resp: 15   Temp: 36.6 C   SpO2: 100%     Last Pain:  Vitals:   11/04/19 1511  TempSrc:   PainSc: 10-Worst pain ever                 KEPHART,WILLIAM K

## 2019-11-04 NOTE — OR Nursing (Signed)
Incentive spirometer given, explained with return demonstration.  Also instructed with use of scopolamine patch with Annice Pih interpreter present.

## 2019-11-04 NOTE — Transfer of Care (Signed)
Immediate Anesthesia Transfer of Care Note  Patient: Veronica Anthony  Procedure(s) Performed: OPEN REDUCTION INTERNAL FIXATION (ORIF) DISTAL RADIAL FRACTURE (Right )  Patient Location: PACU  Anesthesia Type:General  Level of Consciousness: awake and sedated  Airway & Oxygen Therapy: Patient Spontanous Breathing and Patient connected to face mask oxygen  Post-op Assessment: Report given to RN and Post -op Vital signs reviewed and stable  Post vital signs: Reviewed and stable  Last Vitals:  Vitals Value Taken Time  BP    Temp    Pulse    Resp    SpO2      Last Pain:  Vitals:   11/04/19 1113  TempSrc: Temporal  PainSc: 0-No pain         Complications: No apparent anesthesia complications

## 2019-12-06 ENCOUNTER — Encounter: Payer: Self-pay | Admitting: Occupational Therapy

## 2019-12-06 ENCOUNTER — Other Ambulatory Visit: Payer: Self-pay

## 2019-12-06 ENCOUNTER — Ambulatory Visit: Payer: BC Managed Care – PPO | Attending: Orthopedic Surgery | Admitting: Occupational Therapy

## 2019-12-06 DIAGNOSIS — M25531 Pain in right wrist: Secondary | ICD-10-CM

## 2019-12-06 DIAGNOSIS — R208 Other disturbances of skin sensation: Secondary | ICD-10-CM

## 2019-12-06 DIAGNOSIS — M6281 Muscle weakness (generalized): Secondary | ICD-10-CM

## 2019-12-06 DIAGNOSIS — M79641 Pain in right hand: Secondary | ICD-10-CM | POA: Insufficient documentation

## 2019-12-06 DIAGNOSIS — M25631 Stiffness of right wrist, not elsewhere classified: Secondary | ICD-10-CM | POA: Insufficient documentation

## 2019-12-06 DIAGNOSIS — M25641 Stiffness of right hand, not elsewhere classified: Secondary | ICD-10-CM | POA: Insufficient documentation

## 2019-12-06 DIAGNOSIS — R209 Unspecified disturbances of skin sensation: Secondary | ICD-10-CM | POA: Insufficient documentation

## 2019-12-06 NOTE — Therapy (Signed)
[facility]ICINE [address] [place], [address] Phone: [phone]   Fax:  [phone]  Occupational Therapy Evaluation  Patient Details  Name: Veronica Anthony MRN: [id number] Date of Birth: [birth year] No data recorded  Encounter Date: 12/06/2019  OT End of Session - 12/06/19 1811    Visit Number  1    Number of Visits  16    Date for OT Re-Evaluation  01/31/20    OT Start Time  1508    OT Stop Time  1610    OT Time Calculation (min)  62 min    Activity Tolerance  Patient tolerated treatment well    Behavior During Therapy  [facility] for tasks assessed/performed       Past Medical History:  Diagnosis Date  . Complication of anesthesia    vomiting   . GERD (gastroesophageal reflux disease)   . History of kidney stones    2015  . Sleep apnea    2011     Past Surgical History:  Procedure Laterality Date  . OPEN REDUCTION INTERNAL FIXATION (ORIF) DISTAL RADIAL FRACTURE Right 11/04/2019   Procedure: OPEN REDUCTION INTERNAL FIXATION (ORIF) DISTAL RADIAL FRACTURE;  Surgeon: Kennedy Bucker, MD;  Location: [facility] ORS;  Service: Orthopedics;  Laterality: Right;  . TUBAL LIGATION  1991    There were no vitals filed for this visit.  Subjective Assessment - 12/06/19 1806    Subjective   I fell at home -slipping on water. My wrist and hand stiff -and hurting some - because I was afraid doing to much and then mess up the healing of my wrist fx    Pertinent History  Pt fell on 2/10 - close reduction in ER was done - but then needed -  ORIF of the right distal radius. Date of surgery was 11/03/2018 - refer to hand therap because of stiffness and scar massage    Patient Stated Goals  I want to be able to use my R dominant hand and wrist so I can go back to work , work in yard, drive , cook and ride bike    Currently in Pain?  Yes    Pain Score  3     Pain Location  Wrist    Pain Orientation  Right    Pain Descriptors /  Indicators  Aching;Tender;Tightness    Pain Type  Surgical pain    Pain Onset  More than a month ago    Pain Frequency  Intermittent        [facility] OT Assessment - 12/06/19 0001      AROM   Right Forearm Pronation  90 Degrees    Right Forearm Supination  75 Degrees    Right Wrist Extension  32 Degrees    Right Wrist Flexion  22 Degrees    Right Wrist Radial Deviation  20 Degrees    Right Wrist Ulnar Deviation  18 Degrees    Left Wrist Extension  75 Degrees    Left Wrist Flexion  90 Degrees    Left Wrist Radial Deviation  20 Degrees    Left Wrist Ulnar Deviation  32 Degrees      Right Hand AROM   R Thumb MCP 0-60  55 Degrees    R Thumb IP 0-80  60 Degrees    R Thumb Opposition to Index  --   opposition to 2nd fold of 5th    R Index  MCP 0-90  65  Degrees    R Index PIP 0-100  90 Degrees    R Long  MCP 0-90  65 Degrees    R Long PIP 0-100  90 Degrees    R Ring  MCP 0-90  60 Degrees    R Ring PIP 0-100  95 Degrees    R Little  MCP 0-90  45 Degrees    R Little PIP 0-100  100 Degrees               OT Treatments/Exercises (OP) - 12/06/19 0001      RUE Fluidotherapy   Number Minutes Fluidotherapy  10 Minutes    RUE Fluidotherapy Location  Hand;Wrist    Comments  AROM in all planes prior to review of HEP       Review of HEP see flow sheet:     3 x day -contrast  Scar massage and cica scar pad at night time   AROM for wrist in all planes  10 reps  Tapping of digits extention 10 reps And tendon glides 10 reps  Opposition to all digits      OT Education - 12/06/19 1811    Education Details  findings of eval and HEP    Person(s) Educated  Patient    Methods  Explanation;Demonstration;Tactile cues;Verbal cues;Handout    Comprehension  Returned demonstration;Verbalized understanding;Verbal cues required       OT Short Term Goals - 12/06/19 1814      OT SHORT TERM GOAL #1   Title  Pt to be Ind in HEP to increase digits AROM in flexion and extention to WNL     Baseline  extention of digits -15 at [facility] and PIP of 5th , flexion MC's 45-65 degrees , PIP's 90 to 100    Time  3    Period  Weeks    Status  New    Target Date  12/27/19      OT SHORT TERM GOAL #2   Title  Pt to be ind in HEP to increase AROM in R wrist to be able to wean out of splint and use hand in ADL's    Baseline  splint when up and about- wrist decrease flexion 22, ext 32 ,sup 75    Time  4    Period  Weeks    Status  New    Target Date  01/03/20        OT Long Term Goals - 12/06/19 1817      OT LONG TERM GOAL #1   Title  Pt AROM in R wrist increase in all planes more tan 75% compare to R wrist to use hand in more than 75% of function on PRWHE    Baseline  Pt only using hand in 16 % of function onPRWHE -and AROM decrease greatly - see flowsheet    Time  8    Period  Weeks    Status  New    Target Date  01/31/20      OT LONG TERM GOAL #2   Title  R grip and prehension strenght increase to more than 60% compare to L to cut food, hold plate and carry more than 5 lbs    Baseline  NT 5 1/2 wks/s/p - only using hand 16 % in function on PRHWE    Time  8    Period  Weeks    Status  New    Target Date  01/31/20  OT LONG TERM GOAL #3   Title  Function on PRWHE improve with more than 30 points    Baseline  function score on PRWHE 42/50    Time  8    Period  Weeks    Status  New    Target Date  01/31/20            Plan - 12/06/19 1812    Clinical Impression Statement  Pt present 4 1/2 wks s/p ORIF of R distal radius fx - pt is R hand dominant - pt show decrease AROM in digits flexion and extention , increase pain and some numbness reported at times- increase pain with AROM - and decrease wrist AROM in all planes - decrease strength in hand and wrist limiting her functional use of R dominant hand in ADL's and IADL's    OT Occupational Profile and History  Problem Focused Assessment - Including review of records relating to presenting problem    Occupational  performance deficits (Please refer to evaluation for details):  ADL's;IADL's;Play;Leisure;Work;Social Participation    Body Structure / Function / Physical Skills  ADL;Flexibility;ROM;UE functional use;Decreased knowledge of precautions;FMC;Scar mobility;Edema;Pain;IADL;Strength    Rehab Potential  Good    Clinical Decision Making  Limited treatment options, no task modification necessary    Comorbidities Affecting Occupational Performance:  None    Modification or Assistance to Complete Evaluation   No modification of tasks or assist necessary to complete eval    OT Frequency  2x / week    OT Duration  8 weeks    OT Treatment/Interventions  Self-care/ADL training;Therapeutic exercise;Patient/family education;Splinting;Paraffin;Fluidtherapy;Contrast Bath;Manual Therapy;Passive range of motion;Scar mobilization    Plan  assess progress with HEP    OT Home Exercise Plan  see pt instruction    Consulted and Agree with Plan of Care  Patient       Patient will benefit from skilled therapeutic intervention in order to improve the following deficits and impairments:   Body Structure / Function / Physical Skills: ADL, Flexibility, ROM, UE functional use, Decreased knowledge of precautions, FMC, Scar mobility, Edema, Pain, IADL, Strength       Visit Diagnosis: Stiffness of right wrist, not elsewhere classified - Plan: Ot plan of care cert/re-cert  Stiffness of right hand, not elsewhere classified - Plan: Ot plan of care cert/re-cert  Muscle weakness (generalized) - Plan: Ot plan of care cert/re-cert  Pain in right hand - Plan: Ot plan of care cert/re-cert  Pain in right wrist - Plan: Ot plan of care cert/re-cert  Other disturbances of skin sensation - Plan: Ot plan of care cert/re-cert    Problem List There are no problems to display for this patient.   Oletta Cohn OTR/L,CLT 12/06/2019, 6:25 PM  [facility] PHYSICAL AND SPORTS MEDICINE [address] [place], [address] Phone: [phone]   Fax:  [phone]  Name: Veronica Anthony MRN: [id number] Date of Birth: April 25, 1961

## 2019-12-06 NOTE — Patient Instructions (Signed)
3 x day -contrast  Scar massage and cica scar pad at night time   AROM for wrist in all planes  10 reps  Tapping of digits extention 10 reps And tendon glides 10 reps  Opposition to all digits

## 2019-12-13 ENCOUNTER — Ambulatory Visit: Payer: BC Managed Care – PPO | Admitting: Occupational Therapy

## 2019-12-13 ENCOUNTER — Other Ambulatory Visit: Payer: Self-pay

## 2019-12-13 DIAGNOSIS — M6281 Muscle weakness (generalized): Secondary | ICD-10-CM

## 2019-12-13 DIAGNOSIS — M79641 Pain in right hand: Secondary | ICD-10-CM

## 2019-12-13 DIAGNOSIS — R208 Other disturbances of skin sensation: Secondary | ICD-10-CM

## 2019-12-13 DIAGNOSIS — M25531 Pain in right wrist: Secondary | ICD-10-CM

## 2019-12-13 DIAGNOSIS — M25631 Stiffness of right wrist, not elsewhere classified: Secondary | ICD-10-CM | POA: Diagnosis not present

## 2019-12-13 DIAGNOSIS — M25641 Stiffness of right hand, not elsewhere classified: Secondary | ICD-10-CM

## 2019-12-15 ENCOUNTER — Encounter: Payer: Self-pay | Admitting: Occupational Therapy

## 2019-12-15 NOTE — Therapy (Signed)
[facility] [address] [place], [address] Phone: [phone]   Fax:  [phone]  Occupational Therapy Treatment  Patient Details  Name: Veronica Anthony MRN: [id number] Date of Birth: 18-Sep-1960 No data recorded  Encounter Date: 12/13/2019  OT End of Session - 12/15/19 1935    Visit Number  2    Number of Visits  16    Date for OT Re-Evaluation  01/31/20    OT Start Time  1500    OT Stop Time  1548    OT Time Calculation (min)  48 min    Activity Tolerance  Patient tolerated treatment well    Behavior During Therapy  [facility] for tasks assessed/performed       Past Medical History:  Diagnosis Date  . Complication of anesthesia    vomiting   . GERD (gastroesophageal reflux disease)   . History of kidney stones    2015  . Sleep apnea    2011     Past Surgical History:  Procedure Laterality Date  . OPEN REDUCTION INTERNAL FIXATION (ORIF) DISTAL RADIAL FRACTURE Right 11/04/2019   Procedure: OPEN REDUCTION INTERNAL FIXATION (ORIF) DISTAL RADIAL FRACTURE;  Surgeon: Kennedy Bucker, MD;  Location: [facility] ORS;  Service: Orthopedics;  Laterality: Right;  . TUBAL LIGATION  1991    There were no vitals filed for this visit.  Subjective Assessment - 12/15/19 1931    Subjective   Patient reports no pain today. 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  Reports exercises are going well. Reports there are times at night compression glove feels tight and she removes it but keeps brace on. She was so happy today she could now comb her hair and put on her earrings.    Pertinent History  Pt fell on 2/10 - close reduction in ER was done - but then needed -  ORIF of the right distal radius. Date of surgery was 11/03/2018 - refer to hand therap because of stiffness and scar massage    Patient Stated Goals  I want to be able to use my R dominant hand and wrist so I can go back to  work , work in yard, drive , cook and ride bike    Currently in Pain?  No/denies    Pain Score  0-No pain       Interpreter Larena Sox present  Patient seen for fluidotherapy as per flow sheet followed by manual skills for scar management for scar massage, continues to use [facility] care at night and requested a replacement, therapist issued.    Therapeutic Exercises  AROM for wrist in all planes  10 reps Added tabletop exercises for wrist flexion, extension, RD, UD  Tapping of digits extension 10 reps Tendon glides 10 reps  Opposition to all digits    Patient had questions regarding contrast at home, therapist reviewed and answered questions.        OT Treatments/Exercises (OP) - 12/15/19 1937      RUE Fluidotherapy   Number Minutes Fluidotherapy  10 Minutes    RUE Fluidotherapy Location  Hand;Wrist    Comments  AROM in all planes prior to review of HEP              OT Education - 12/15/19 1934    Education Details  HEP    Person(s) Educated  Patient    Methods  Explanation;Demonstration;Tactile cues;Verbal cues;Handout  Comprehension  Returned demonstration;Verbalized understanding;Verbal cues required       OT Short Term Goals - 12/06/19 1814      OT SHORT TERM GOAL #1   Title  Pt to be Ind in HEP to increase digits AROM in flexion and extention to WNL    Baseline  extention of digits -15 at [facility] and PIP of 5th , flexion MC's 45-65 degrees , PIP's 90 to 100    Time  3    Period  Weeks    Status  New    Target Date  12/27/19      OT SHORT TERM GOAL #2   Title  Pt to be ind in HEP to increase AROM in R wrist to be able to wean out of splint and use hand in ADL's    Baseline  splint when up and about- wrist decrease flexion 22, ext 32 ,sup 75    Time  4    Period  Weeks    Status  New    Target Date  01/03/20        OT Long Term Goals - 12/06/19 1817      OT LONG TERM GOAL #1   Title  Pt AROM in R wrist increase in all planes more tan 75% compare to R  wrist to use hand in more than 75% of function on PRWHE    Baseline  Pt only using hand in 16 % of function onPRWHE -and AROM decrease greatly - see flowsheet    Time  8    Period  Weeks    Status  New    Target Date  01/31/20      OT LONG TERM GOAL #2   Title  R grip and prehension strenght increase to more than 60% compare to L to cut food, hold plate and carry more than 5 lbs    Baseline  NT 5 1/2 wks/s/p - only using hand 16 % in function on PRHWE    Time  8    Period  Weeks    Status  New    Target Date  01/31/20      OT LONG TERM GOAL #3   Title  Function on PRWHE improve with more than 30 points    Baseline  function score on PRWHE 42/50    Time  8    Period  Weeks    Status  New    Target Date  01/31/20            Plan - 12/15/19 1935    Clinical Impression Statement  Patient progressing well, no pain this date and patient is participating more with self care tasks, able to put on earrings and comb hair.  Requires cues for exercises  for form and technique.  Will continue to progress with exercises next session.  Continue OT towards goals in plan of care to increase ROM, strength and functional use of UE for necessary daily tasks.    OT Occupational Profile and History  Problem Focused Assessment - Including review of records relating to presenting problem    Occupational performance deficits (Please refer to evaluation for details):  ADL's;IADL's;Play;Leisure;Work;Social Participation    Body Structure / Function / Physical Skills  ADL;Flexibility;ROM;UE functional use;Decreased knowledge of precautions;FMC;Scar mobility;Edema;Pain;IADL;Strength    Rehab Potential  Good    Clinical Decision Making  Limited treatment options, no task modification necessary    Comorbidities Affecting Occupational Performance:  None    Modification  or Assistance to Complete Evaluation   No modification of tasks or assist necessary to complete eval    OT Frequency  2x / week    OT  Duration  8 weeks    OT Treatment/Interventions  Self-care/ADL training;Therapeutic exercise;Patient/family education;Splinting;Paraffin;Fluidtherapy;Contrast Bath;Manual Therapy;Passive range of motion;Scar mobilization    Consulted and Agree with Plan of Care  Patient       Patient will benefit from skilled therapeutic intervention in order to improve the following deficits and impairments:   Body Structure / Function / Physical Skills: ADL, Flexibility, ROM, UE functional use, Decreased knowledge of precautions, FMC, Scar mobility, Edema, Pain, IADL, Strength       Visit Diagnosis: Stiffness of right hand, not elsewhere classified  Stiffness of right wrist, not elsewhere classified  Muscle weakness (generalized)  Pain in right hand  Pain in right wrist  Other disturbances of skin sensation    Problem List There are no problems to display for this patient.  Jermiah Howton Oneita Jolly, OTR/L, CLT  Zorah Backes 12/15/2019, 7:48 PM  [place] [facility] [address], [place], [address] Phone: [phone]   Fax:  [phone]  Name: Carmelia Tiner MRN: [id number] Date of Birth: 01-02-1961

## 2019-12-16 ENCOUNTER — Ambulatory Visit: Payer: BC Managed Care – PPO | Attending: Orthopedic Surgery | Admitting: Occupational Therapy

## 2019-12-16 ENCOUNTER — Other Ambulatory Visit: Payer: Self-pay

## 2019-12-16 DIAGNOSIS — M25531 Pain in right wrist: Secondary | ICD-10-CM | POA: Insufficient documentation

## 2019-12-16 DIAGNOSIS — R208 Other disturbances of skin sensation: Secondary | ICD-10-CM

## 2019-12-16 DIAGNOSIS — M25641 Stiffness of right hand, not elsewhere classified: Secondary | ICD-10-CM | POA: Insufficient documentation

## 2019-12-16 DIAGNOSIS — M25631 Stiffness of right wrist, not elsewhere classified: Secondary | ICD-10-CM | POA: Diagnosis present

## 2019-12-16 DIAGNOSIS — M79641 Pain in right hand: Secondary | ICD-10-CM | POA: Insufficient documentation

## 2019-12-16 DIAGNOSIS — M6281 Muscle weakness (generalized): Secondary | ICD-10-CM | POA: Insufficient documentation

## 2019-12-16 DIAGNOSIS — R209 Unspecified disturbances of skin sensation: Secondary | ICD-10-CM | POA: Insufficient documentation

## 2019-12-16 NOTE — Therapy (Signed)
[facility] [facility] [address] [place], [address] Phone: [phone]   Fax:  [phone]  Occupational Therapy Treatment  Patient Details  Name: Veronica Anthony MRN: [id number] Date of Birth: 1960/12/21 No data recorded  Encounter Date: 12/16/2019  OT End of Session - 12/19/19 1326    Number of Visits  16    Date for OT Re-Evaluation  01/31/20    OT Start Time  1506    OT Stop Time  1557    OT Time Calculation (min)  51 min    Activity Tolerance  Patient tolerated treatment well    Behavior During Therapy  [facility] for tasks assessed/performed       Past Medical History:  Diagnosis Date  . Complication of anesthesia    vomiting   . GERD (gastroesophageal reflux disease)   . History of kidney stones    2015  . Sleep apnea    2011     Past Surgical History:  Procedure Laterality Date  . OPEN REDUCTION INTERNAL FIXATION (ORIF) DISTAL RADIAL FRACTURE Right 11/04/2019   Procedure: OPEN REDUCTION INTERNAL FIXATION (ORIF) DISTAL RADIAL FRACTURE;  Surgeon: Kennedy Bucker, MD;  Location: [facility] ORS;  Service: Orthopedics;  Laterality: Right;  . TUBAL LIGATION  1991    There were no vitals filed for this visit.  Subjective Assessment - 12/19/19 1325    Subjective   Patient still confused about contrast bath and asking if she should do warm one day and cold another day.  Reviewed protocol again. Patient asking if cold water will cause her to have arthritis.  Interpreter present and was able to interpret explanation to patient.    Pertinent History  Pt fell on 2/10 - close reduction in ER was done - but then needed -  ORIF of the right distal radius. Date of surgery was 11/03/2018 - refer to hand therap because of stiffness and scar massage    Patient Stated Goals  I want to be able to use my R dominant hand and wrist so I can go back to work , work in yard, drive , cook and ride bike    Currently in Pain?  Yes    Pain  Score  2     Pain Location  Wrist    Pain Orientation  Right    Pain Descriptors / Indicators  Aching    Pain Type  Surgical pain    Pain Onset  More than a month ago    Pain Frequency  Intermittent    Multiple Pain Sites  No        Patient reports she is now able to hold a bottle of water stable with right hand to open with left hand, also able to use hand for opening the door.   Patient seen for fluidotherapy as per flow sheet to increase ROM, decrease pain.  Review of contrast for management of edema as a part of home program  [facility] OT Assessment - 12/19/19 1336      AROM   Right Wrist Extension  35 Degrees    Right Wrist Flexion  28 Degrees    Right Wrist Radial Deviation  20 Degrees    Right Wrist Ulnar Deviation  20 Degrees     Patient seen for use of manual skills for soft tissue mobilization with scar massage techniques to right wrist.  Patient reinstructed on scar massage for home, continues to use [facility] care at night.  Exercises for tendon gliding with occasional cues,  AROM of right wrist in all planes for 10 reps each, tabletop exercises this date for wrist flexion, extension, RD, UD with therapist demo and cues.  Oppositional movements to all digits.    Patient requires reinstruction of exercises and contrast each session.  Decreased pain and increased motion noted.            OT Treatments/Exercises (OP) - 12/19/19 1330      RUE Fluidotherapy   Number Minutes Fluidotherapy  10 Minutes    RUE Fluidotherapy Location  Hand;Wrist    Comments  AROM in all planes prior to review of HEP             OT Education - 12/19/19 1326    Education Details  review of contrast bath again, HEP, Scar massage    Person(s) Educated  Patient    Methods  Explanation;Demonstration;Tactile cues;Verbal cues;Handout    Comprehension  Returned demonstration;Verbalized understanding;Verbal cues required       OT Short Term Goals - 12/06/19 1814      OT SHORT TERM  GOAL #1   Title  Pt to be Ind in HEP to increase digits AROM in flexion and extention to WNL    Baseline  extention of digits -15 at [facility] and PIP of 5th , flexion MC's 45-65 degrees , PIP's 90 to 100    Time  3    Period  Weeks    Status  New    Target Date  12/27/19      OT SHORT TERM GOAL #2   Title  Pt to be ind in HEP to increase AROM in R wrist to be able to wean out of splint and use hand in ADL's    Baseline  splint when up and about- wrist decrease flexion 22, ext 32 ,sup 75    Time  4    Period  Weeks    Status  New    Target Date  01/03/20        OT Long Term Goals - 12/06/19 1817      OT LONG TERM GOAL #1   Title  Pt AROM in R wrist increase in all planes more tan 75% compare to R wrist to use hand in more than 75% of function on PRWHE    Baseline  Pt only using hand in 16 % of function onPRWHE -and AROM decrease greatly - see flowsheet    Time  8    Period  Weeks    Status  New    Target Date  01/31/20      OT LONG TERM GOAL #2   Title  R grip and prehension strenght increase to more than 60% compare to L to cut food, hold plate and carry more than 5 lbs    Baseline  NT 5 1/2 wks/s/p - only using hand 16 % in function on PRHWE    Time  8    Period  Weeks    Status  New    Target Date  01/31/20      OT LONG TERM GOAL #3   Title  Function on PRWHE improve with more than 30 points    Baseline  function score on PRWHE 42/50    Time  8    Period  Weeks    Status  New    Target Date  01/31/20            Plan -  12/19/19 1327    Clinical Impression Statement  Patient continues to progress, she often needs reinstruction of exercises and explanation of contrast for edema control, intrepreter present to address language barrier.  Patient with decreased pain this date and reporting increased functional use with being able to use hand to hold water bottle and open door.  Able to demonstrate HEP with cues and therapist demo.  Continue OT towards goals in plan of  care to maximize safety and independence in daily tasks.    OT Occupational Profile and History  Problem Focused Assessment - Including review of records relating to presenting problem    Occupational performance deficits (Please refer to evaluation for details):  ADL's;IADL's;Play;Leisure;Work;Social Participation    Body Structure / Function / Physical Skills  ADL;Flexibility;ROM;UE functional use;Decreased knowledge of precautions;FMC;Scar mobility;Edema;Pain;IADL;Strength    Rehab Potential  Good    Clinical Decision Making  Limited treatment options, no task modification necessary    Comorbidities Affecting Occupational Performance:  None    Modification or Assistance to Complete Evaluation   No modification of tasks or assist necessary to complete eval    OT Frequency  2x / week    OT Duration  8 weeks    OT Treatment/Interventions  Self-care/ADL training;Therapeutic exercise;Patient/family education;Splinting;Paraffin;Fluidtherapy;Contrast Bath;Manual Therapy;Passive range of motion;Scar mobilization    Consulted and Agree with Plan of Care  Patient       Patient will benefit from skilled therapeutic intervention in order to improve the following deficits and impairments:   Body Structure / Function / Physical Skills: ADL, Flexibility, ROM, UE functional use, Decreased knowledge of precautions, FMC, Scar mobility, Edema, Pain, IADL, Strength       Visit Diagnosis: Stiffness of right wrist, not elsewhere classified  Stiffness of right hand, not elsewhere classified  Muscle weakness (generalized)  Pain in right hand  Pain in right wrist  Other disturbances of skin sensation    Problem List There are no problems to display for this patient.  Burr Soffer Oneita Jolly, OTR/L, CLT  Rhonda Vangieson 12/20/2019, 7:17 PM  [place] [facility] [address] [place], [address] Phone: [phone]   Fax:  [phone]  Name:  Veronica Anthony MRN: [id number] Date of Birth: [birth year]

## 2019-12-17 ENCOUNTER — Ambulatory Visit: Payer: BC Managed Care – PPO | Admitting: Occupational Therapy

## 2019-12-21 ENCOUNTER — Ambulatory Visit: Payer: BC Managed Care – PPO | Admitting: Occupational Therapy

## 2019-12-21 ENCOUNTER — Other Ambulatory Visit: Payer: Self-pay

## 2019-12-21 DIAGNOSIS — M25631 Stiffness of right wrist, not elsewhere classified: Secondary | ICD-10-CM | POA: Diagnosis not present

## 2019-12-21 DIAGNOSIS — M6281 Muscle weakness (generalized): Secondary | ICD-10-CM

## 2019-12-21 DIAGNOSIS — M25531 Pain in right wrist: Secondary | ICD-10-CM

## 2019-12-21 DIAGNOSIS — R208 Other disturbances of skin sensation: Secondary | ICD-10-CM

## 2019-12-21 DIAGNOSIS — M79641 Pain in right hand: Secondary | ICD-10-CM

## 2019-12-21 DIAGNOSIS — M25641 Stiffness of right hand, not elsewhere classified: Secondary | ICD-10-CM

## 2019-12-21 NOTE — Therapy (Signed)
[facility] [address] [place], [address] Phone: [phone]   Fax:  [phone]  Occupational Therapy Treatment  Patient Details  Name: Veronica Anthony MRN: [id number] Date of Birth: March 11, 1961 No data recorded  Encounter Date: 12/21/2019  OT End of Session - 12/21/19 1611    Visit Number  4    Number of Visits  16    Date for OT Re-Evaluation  01/31/20    OT Start Time  1514    OT Stop Time  1604    OT Time Calculation (min)  50 min    Activity Tolerance  Patient tolerated treatment well    Behavior During Therapy  [facility] for tasks assessed/performed       Past Medical History:  Diagnosis Date  . Complication of anesthesia    vomiting   . GERD (gastroesophageal reflux disease)   . History of kidney stones    2015  . Sleep apnea    2011     Past Surgical History:  Procedure Laterality Date  . OPEN REDUCTION INTERNAL FIXATION (ORIF) DISTAL RADIAL FRACTURE Right 11/04/2019   Procedure: OPEN REDUCTION INTERNAL FIXATION (ORIF) DISTAL RADIAL FRACTURE;  Surgeon: Kennedy Bucker, MD;  Location: [facility] ORS;  Service: Orthopedics;  Laterality: Right;  . TUBAL LIGATION  1991    There were no vitals filed for this visit.  Subjective Assessment - 12/21/19 1609    Subjective   My fingers are my biggest problem, they are stiff and sore ,but I am using my hand more - drove today and done my hair -helping little with the R Hand    Pertinent History  Pt fell on 2/10 - close reduction in ER was done - but then needed -  ORIF of the right distal radius. Date of surgery was 11/03/2018 - refer to hand therap because of stiffness and scar massage    Patient Stated Goals  I want to be able to use my R dominant hand and wrist so I can go back to work , work in yard, drive , cook and ride bike    Currently in Pain?  No/denies         [facility] OT Assessment - 12/21/19 0001      AROM   Right Forearm Pronation  90 Degrees     Right Forearm Supination  80 Degrees    Right Wrist Extension  42 Degrees    Right Wrist Flexion  34 Degrees    Right Wrist Radial Deviation  20 Degrees    Right Wrist Ulnar Deviation  20 Degrees       assess AROM for wrist in all planes and scar adhesion  See flowsheet  digits cont to be stiff in flexion and end range extention -with pain over MC's - with 5th worse and 2nd /3rd           OT Treatments/Exercises (OP) - 12/21/19 0001      RUE Fluidotherapy   Number Minutes Fluidotherapy  10 Minutes    RUE Fluidotherapy Location  Hand;Wrist    Comments  AROM to wrist and digist in all planes       scar massage done and pt ed on  Soft tissue mobs done to webspace and MC spreads - husband to assist pt with it   review with pt again - tendon glides - block MC and intrinsic fist And full fist to 3 cm and 2 cm foam block -  no palm  Opposition  And then review AAROM over edge of table for flexion ,ext, RD, UD -last time added to HEP  10 reps done  And supination wheel provided for pt to use for sup end range  Did add 1 lbs for wrist in all planes - pain free  10 reps  All HEP 2 x day after contrast and cont with isotoner glove       OT Education - 12/21/19 1610    Education Details  HEP review - pt still need assistance - and add 1 lbs weight    Person(s) Educated  Patient    Methods  Explanation;Demonstration;Tactile cues;Verbal cues;Handout    Comprehension  Returned demonstration;Verbalized understanding;Verbal cues required       OT Short Term Goals - 12/06/19 1814      OT SHORT TERM GOAL #1   Title  Pt to be Ind in HEP to increase digits AROM in flexion and extention to WNL    Baseline  extention of digits -15 at [facility] and PIP of 5th , flexion MC's 45-65 degrees , PIP's 90 to 100    Time  3    Period  Weeks    Status  New    Target Date  12/27/19      OT SHORT TERM GOAL #2   Title  Pt to be ind in HEP to increase AROM in R wrist to be able to wean out of  splint and use hand in ADL's    Baseline  splint when up and about- wrist decrease flexion 22, ext 32 ,sup 75    Time  4    Period  Weeks    Status  New    Target Date  01/03/20        OT Long Term Goals - 12/06/19 1817      OT LONG TERM GOAL #1   Title  Pt AROM in R wrist increase in all planes more tan 75% compare to R wrist to use hand in more than 75% of function on PRWHE    Baseline  Pt only using hand in 16 % of function onPRWHE -and AROM decrease greatly - see flowsheet    Time  8    Period  Weeks    Status  New    Target Date  01/31/20      OT LONG TERM GOAL #2   Title  R grip and prehension strenght increase to more than 60% compare to L to cut food, hold plate and carry more than 5 lbs    Baseline  NT 5 1/2 wks/s/p - only using hand 16 % in function on PRHWE    Time  8    Period  Weeks    Status  New    Target Date  01/31/20      OT LONG TERM GOAL #3   Title  Function on PRWHE improve with more than 30 points    Baseline  function score on PRWHE 42/50    Time  8    Period  Weeks    Status  New    Target Date  01/31/20            Plan - 12/21/19 1611    Clinical Impression Statement  Pt is about 6 wks s/p R distal radius fx with ORIF - pt show progress in wrist AROM - but cont to have digits stiffness and pain - soft tissue tightness - pt take off  her splint during day and only wear it night time and with heavy activities or when out and about - should help with stiffness in digits and wrist -    OT Occupational Profile and History  Problem Focused Assessment - Including review of records relating to presenting problem    Occupational performance deficits (Please refer to evaluation for details):  ADL's;IADL's;Play;Leisure;Work;Social Participation    Body Structure / Function / Physical Skills  ADL;Flexibility;ROM;UE functional use;Decreased knowledge of precautions;FMC;Scar mobility;Edema;Pain;IADL;Strength    Rehab Potential  Good    Clinical Decision  Making  Limited treatment options, no task modification necessary    Comorbidities Affecting Occupational Performance:  None    Modification or Assistance to Complete Evaluation   No modification of tasks or assist necessary to complete eval    OT Frequency  2x / week    OT Duration  --   7wks   OT Treatment/Interventions  Self-care/ADL training;Therapeutic exercise;Patient/family education;Splinting;Paraffin;Fluidtherapy;Contrast Bath;Manual Therapy;Passive range of motion;Scar mobilization    Plan  assess progress with HEP    OT Home Exercise Plan  see pt instruction    Consulted and Agree with Plan of Care  Patient       Patient will benefit from skilled therapeutic intervention in order to improve the following deficits and impairments:   Body Structure / Function / Physical Skills: ADL, Flexibility, ROM, UE functional use, Decreased knowledge of precautions, FMC, Scar mobility, Edema, Pain, IADL, Strength       Visit Diagnosis: Stiffness of right wrist, not elsewhere classified  Muscle weakness (generalized)  Pain in right hand  Pain in right wrist  Other disturbances of skin sensation  Stiffness of right hand, not elsewhere classified    Problem List There are no problems to display for this patient.   Oletta Cohn OTR/L,CLT 12/21/2019, 4:14 PM  [facility] [address], [place], [address] Phone: [phone]   Fax:  [phone]  Name: Dilan Fullenwider MRN: [id number] Date of Birth: Aug 05, 1961

## 2019-12-21 NOTE — Patient Instructions (Signed)
Same HEP for digits and wrist AAROM  Add this date 1 lbs weight for wrist in all planes  10 reps  pain free  And supination wheel for supination - Barbaraann Boys

## 2019-12-23 ENCOUNTER — Ambulatory Visit: Payer: BC Managed Care – PPO | Admitting: Occupational Therapy

## 2019-12-24 ENCOUNTER — Ambulatory Visit: Payer: BC Managed Care – PPO | Admitting: Occupational Therapy

## 2019-12-24 ENCOUNTER — Other Ambulatory Visit: Payer: Self-pay

## 2019-12-24 DIAGNOSIS — M25531 Pain in right wrist: Secondary | ICD-10-CM

## 2019-12-24 DIAGNOSIS — R208 Other disturbances of skin sensation: Secondary | ICD-10-CM

## 2019-12-24 DIAGNOSIS — M6281 Muscle weakness (generalized): Secondary | ICD-10-CM

## 2019-12-24 DIAGNOSIS — M79641 Pain in right hand: Secondary | ICD-10-CM

## 2019-12-24 DIAGNOSIS — M25631 Stiffness of right wrist, not elsewhere classified: Secondary | ICD-10-CM | POA: Diagnosis not present

## 2019-12-24 DIAGNOSIS — M25641 Stiffness of right hand, not elsewhere classified: Secondary | ICD-10-CM

## 2019-12-24 NOTE — Patient Instructions (Signed)
Same

## 2019-12-24 NOTE — Therapy (Signed)
[employer] PHYSICAL AND SPORTS MEDICINE [address] [place], [address] Phone: [phone]   Fax:  [phone]  Occupational Therapy Treatment  Patient Details  Name: Veronica Anthony MRN: [id number] Date of Birth: May 06, 1961 No data recorded  Encounter Date: 12/24/2019  OT End of Session - 12/24/19 0944    Visit Number  5    Number of Visits  16    Date for OT Re-Evaluation  01/31/20    OT Start Time  0930    OT Stop Time  1015    OT Time Calculation (min)  45 min    Activity Tolerance  Patient tolerated treatment well    Behavior During Therapy  [facility] for tasks assessed/performed       Past Medical History:  Diagnosis Date  . Complication of anesthesia    vomiting   . GERD (gastroesophageal reflux disease)   . History of kidney stones    2015  . Sleep apnea    2011     Past Surgical History:  Procedure Laterality Date  . OPEN REDUCTION INTERNAL FIXATION (ORIF) DISTAL RADIAL FRACTURE Right 11/04/2019   Procedure: OPEN REDUCTION INTERNAL FIXATION (ORIF) DISTAL RADIAL FRACTURE;  Surgeon: Hessie Knows, MD;  Location: [facility] ORS;  Service: Orthopedics;  Laterality: Right;  . TUBAL LIGATION  1991    There were no vitals filed for this visit.  Subjective Assessment - 12/24/19 0941    Subjective   Little pain at night time- did not wear my splint during day - only night time - did wear glove -I think my fingers are better    Pertinent History  Pt fell on 2/10 - close reduction in ER was done - but then needed -  ORIF of the right distal radius. Date of surgery was 11/03/2018 - refer to hand therap because of stiffness and scar massage    Patient Stated Goals  I want to be able to use my R dominant hand and wrist so I can go back to work , work in yard, drive , cook and ride bike    Currently in Pain?  Yes    Pain Score  2     Pain Location  Wrist    Pain Orientation  Right    Pain Descriptors / Indicators  Aching;Tightness    Pain  Type  Surgical pain    Pain Onset  More than a month ago    Pain Frequency  Intermittent         [facility] OT Assessment - 12/24/19 0001      AROM   Right Wrist Extension  50 Degrees   end of CPM    Right Wrist Flexion  40 Degrees   end of session              OT Treatments/Exercises (OP) - 12/24/19 0001      RUE Fluidotherapy   Number Minutes Fluidotherapy  8 Minutes    RUE Fluidotherapy Location  Hand;Wrist    Comments  AROM for digits and wrist        Soft tissue mobs done to webspace and MC spreads - husband to assist pt with it   review with pt again - tendon glides - block MC but add AAROM for MC flexion and intrinsic fist And full fist to 2 cm foam block - able to cut pt foam down smaller - great progress after fluido Opposition to all digits  And then review AAROM over  edge of table for flexion ,ext ( pt had hard time to relax and feel stretch and NOT pain )  Cont at home  RD, UD PROM  10 reps done  And cont with supination wheel provided for pt to use for sup end range  This date CPM for wrist flexion , ext 200 sec -  And review need min A to do 1 lbs for wrist in all planes - pain free  10 reps  All HEP 2 x day after contrast and cont with isotoner glove        OT Education - 12/24/19 0944    Education Details  HEP review - pt still need assistance - and add 1 lbs weight    Person(s) Educated  Patient    Methods  Explanation;Demonstration;Tactile cues;Verbal cues;Handout    Comprehension  Returned demonstration;Verbalized understanding;Verbal cues required       OT Short Term Goals - 12/06/19 1814      OT SHORT TERM GOAL #1   Title  Pt to be Ind in HEP to increase digits AROM in flexion and extention to WNL    Baseline  extention of digits -15 at [facility] and PIP of 5th , flexion MC's 45-65 degrees , PIP's 90 to 100    Time  3    Period  Weeks    Status  New    Target Date  12/27/19      OT SHORT TERM GOAL #2   Title  Pt to be ind in HEP to  increase AROM in R wrist to be able to wean out of splint and use hand in ADL's    Baseline  splint when up and about- wrist decrease flexion 22, ext 32 ,sup 75    Time  4    Period  Weeks    Status  New    Target Date  01/03/20        OT Long Term Goals - 12/06/19 1817      OT LONG TERM GOAL #1   Title  Pt AROM in R wrist increase in all planes more tan 75% compare to R wrist to use hand in more than 75% of function on PRWHE    Baseline  Pt only using hand in 16 % of function onPRWHE -and AROM decrease greatly - see flowsheet    Time  8    Period  Weeks    Status  New    Target Date  01/31/20      OT LONG TERM GOAL #2   Title  R grip and prehension strenght increase to more than 60% compare to L to cut food, hold plate and carry more than 5 lbs    Baseline  NT 5 1/2 wks/s/p - only using hand 16 % in function on PRHWE    Time  8    Period  Weeks    Status  New    Target Date  01/31/20      OT LONG TERM GOAL #3   Title  Function on PRWHE improve with more than 30 points    Baseline  function score on PRWHE 42/50    Time  8    Period  Weeks    Status  New    Target Date  01/31/20            Plan - 12/24/19 0946    Clinical Impression Statement  Pt is 6 1/2 wks s/p R distal radius fx with  ORIF - pt show increase AROM in digits this date - during session - add some AAROM to [facility] flexion - and CPM for wrist flexion ,ext - reviewe HEP with pt again - need mod A - encourage increase functional use of R hand at home    OT Occupational Profile and History  Problem Focused Assessment - Including review of records relating to presenting problem    Occupational performance deficits (Please refer to evaluation for details):  ADL's;IADL's;Play;Leisure;Work;Social Participation    Body Structure / Function / Physical Skills  ADL;Flexibility;ROM;UE functional use;Decreased knowledge of precautions;FMC;Scar mobility;Edema;Pain;IADL;Strength    Rehab Potential  Good    Clinical Decision  Making  Limited treatment options, no task modification necessary    Comorbidities Affecting Occupational Performance:  None    Modification or Assistance to Complete Evaluation   No modification of tasks or assist necessary to complete eval    OT Frequency  2x / week    OT Duration  6 weeks    OT Treatment/Interventions  Self-care/ADL training;Therapeutic exercise;Patient/family education;Splinting;Paraffin;Fluidtherapy;Contrast Bath;Manual Therapy;Passive range of motion;Scar mobilization    Plan  assess progress with HEP    OT Home Exercise Plan  see pt instruction    Consulted and Agree with Plan of Care  Patient       Patient will benefit from skilled therapeutic intervention in order to improve the following deficits and impairments:   Body Structure / Function / Physical Skills: ADL, Flexibility, ROM, UE functional use, Decreased knowledge of precautions, FMC, Scar mobility, Edema, Pain, IADL, Strength       Visit Diagnosis: Stiffness of right wrist, not elsewhere classified  Muscle weakness (generalized)  Pain in right hand  Pain in right wrist  Other disturbances of skin sensation  Stiffness of right hand, not elsewhere classified    Problem List There are no problems to display for this patient.   Oletta Cohn OTR/l,CLT 12/24/2019, 1:06 PM  [facility] PHYSICAL AND SPORTS MEDICINE [address] [place], [address] Phone: [phone]   Fax:  [phone]  Name: Veronica Anthony MRN: [id number] Date of Birth: [birth year]

## 2019-12-28 ENCOUNTER — Other Ambulatory Visit: Payer: Self-pay

## 2019-12-28 ENCOUNTER — Ambulatory Visit: Payer: BC Managed Care – PPO | Admitting: Occupational Therapy

## 2019-12-28 DIAGNOSIS — M79641 Pain in right hand: Secondary | ICD-10-CM

## 2019-12-28 DIAGNOSIS — M25531 Pain in right wrist: Secondary | ICD-10-CM

## 2019-12-28 DIAGNOSIS — M25641 Stiffness of right hand, not elsewhere classified: Secondary | ICD-10-CM

## 2019-12-28 DIAGNOSIS — M25631 Stiffness of right wrist, not elsewhere classified: Secondary | ICD-10-CM

## 2019-12-28 DIAGNOSIS — M6281 Muscle weakness (generalized): Secondary | ICD-10-CM

## 2019-12-28 DIAGNOSIS — R208 Other disturbances of skin sensation: Secondary | ICD-10-CM

## 2019-12-28 NOTE — Patient Instructions (Signed)
1 lbs weight to do 2 sets of 10  2 x day And increase wrist flexion stretch to 3-4 x 30 sec to 1 min  And add PROM for composite flexion to digits

## 2019-12-28 NOTE — Therapy (Signed)
[facility] MEDICINE [address] [place], [address] Phone: [phone]   Fax:  [phone]  Occupational Therapy Treatment  Patient Details  Name: Veronica Anthony MRN: [id number] Date of Birth: 20-Mar-1961 No data recorded  Encounter Date: 12/28/2019  OT End of Session - 12/28/19 1120    Visit Number  6    Number of Visits  16    Date for OT Re-Evaluation  01/31/20    OT Start Time  1101    OT Stop Time  1145    OT Time Calculation (min)  44 min    Activity Tolerance  Patient tolerated treatment well    Behavior During Therapy  [facility] for tasks assessed/performed       Past Medical History:  Diagnosis Date  . Complication of anesthesia    vomiting   . GERD (gastroesophageal reflux disease)   . History of kidney stones    2015  . Sleep apnea    2011     Past Surgical History:  Procedure Laterality Date  . OPEN REDUCTION INTERNAL FIXATION (ORIF) DISTAL RADIAL FRACTURE Right 11/04/2019   Procedure: OPEN REDUCTION INTERNAL FIXATION (ORIF) DISTAL RADIAL FRACTURE;  Surgeon: Kennedy Bucker, MD;  Location: [facility] ORS;  Service: Orthopedics;  Laterality: Right;  . TUBAL LIGATION  1991    There were no vitals filed for this visit.  Subjective Assessment - 12/28/19 1119    Subjective   My hand and wrist felt great yesterday - close to normal and used it more- but today little sore and still swollen - more stiff in the am and still sleeping with my splint    Pertinent History  Pt fell on 2/10 - close reduction in ER was done - but then needed -  ORIF of the right distal radius. Date of surgery was 11/03/2018 - refer to hand therap because of stiffness and scar massage    Patient Stated Goals  I want to be able to use my R dominant hand and wrist so I can go back to work , work in yard, drive , cook and ride bike    Currently in Pain?  Yes    Pain Score  1     Pain Location  Wrist    Pain Orientation  Right    Pain  Descriptors / Indicators  Aching;Tightness    Pain Type  Surgical pain    Pain Onset  More than a month ago    Pain Frequency  Intermittent         [facility] OT Assessment - 12/28/19 0001      AROM   Right Forearm Pronation  90 Degrees    Right Forearm Supination  90 Degrees    Right Wrist Extension  50 Degrees    Right Wrist Flexion  38 Degrees    Right Wrist Radial Deviation  20 Degrees    Right Wrist Ulnar Deviation  20 Degrees      5th digit MC flexion increase to 70 degrees  Wrist extention improving more than flexion - supination WNL now          OT Treatments/Exercises (OP) - 12/28/19 0001      RUE Fluidotherapy   Number Minutes Fluidotherapy  8 Minutes    RUE Fluidotherapy Location  Hand;Wrist    Comments  AROM for wrist and digits        AAROM by OT done for tendon glides - pt to do  block MC  AAROM for MC flexion at home and composite flexion prior to place and hold  And full fist to 2 cm foam block - able to cut pt foam down smaller - Opposition to all digits  AAROM and PROM by OT done for wrist flexion ,ext - prolonged flexion and extention stretch by OT this date  Pt to focus on flexion stretch for wrist - 3-4 x 30 sec to 1 min  Cont at home  AAROM over edge of table for flexion ,ext ( pt had hard time to relax and feel stretch and NOT pain )  Cont at home  RD, UD PROM  10 reps done  And cont with supination wheel provided for pt to use for sup end range    CPM for wrist flexion , ext 200 sec -  And review again1 lbs for wrist in all planes - pain free  10 reps  Can increase to 2 sets for 10  All HEP 2 x day after contrast and cont with isotoner glove Can try and sleep without splint - to decrease digits stiffness        OT Education - 12/28/19 1120    Education Details  HEP review - and upgraded    Person(s) Educated  Patient    Methods  Explanation;Demonstration;Tactile cues;Verbal cues;Handout    Comprehension  Returned  demonstration;Verbalized understanding;Verbal cues required       OT Short Term Goals - 12/06/19 1814      OT SHORT TERM GOAL #1   Title  Pt to be Ind in HEP to increase digits AROM in flexion and extention to WNL    Baseline  extention of digits -15 at [facility] and PIP of 5th , flexion MC's 45-65 degrees , PIP's 90 to 100    Time  3    Period  Weeks    Status  New    Target Date  12/27/19      OT SHORT TERM GOAL #2   Title  Pt to be ind in HEP to increase AROM in R wrist to be able to wean out of splint and use hand in ADL's    Baseline  splint when up and about- wrist decrease flexion 22, ext 32 ,sup 75    Time  4    Period  Weeks    Status  New    Target Date  01/03/20        OT Long Term Goals - 12/06/19 1817      OT LONG TERM GOAL #1   Title  Pt AROM in R wrist increase in all planes more tan 75% compare to R wrist to use hand in more than 75% of function on PRWHE    Baseline  Pt only using hand in 16 % of function onPRWHE -and AROM decrease greatly - see flowsheet    Time  8    Period  Weeks    Status  New    Target Date  01/31/20      OT LONG TERM GOAL #2   Title  R grip and prehension strenght increase to more than 60% compare to L to cut food, hold plate and carry more than 5 lbs    Baseline  NT 5 1/2 wks/s/p - only using hand 16 % in function on PRHWE    Time  8    Period  Weeks    Status  New    Target Date  01/31/20  OT LONG TERM GOAL #3   Title  Function on PRWHE improve with more than 30 points    Baseline  function score on PRWHE 42/50    Time  8    Period  Weeks    Status  New    Target Date  01/31/20            Plan - 12/28/19 1121    Clinical Impression Statement  Pt is 7 wks s/p R distal radius fx with ORIF - pt showing progress in AROM wrist and digits as well as strenght- pt to focus on wrist flexion and composite flexion to digits - CPM done again this date - did not add putty yet because of stiffness in digits and some tenderness over  3rd A1pulley    OT Occupational Profile and History  Problem Focused Assessment - Including review of records relating to presenting problem    Occupational performance deficits (Please refer to evaluation for details):  ADL's;IADL's;Play;Leisure;Work;Social Participation    Body Structure / Function / Physical Skills  ADL;Flexibility;ROM;UE functional use;Decreased knowledge of precautions;FMC;Scar mobility;Edema;Pain;IADL;Strength    Rehab Potential  Good    Clinical Decision Making  Limited treatment options, no task modification necessary    Comorbidities Affecting Occupational Performance:  None    Modification or Assistance to Complete Evaluation   No modification of tasks or assist necessary to complete eval    OT Frequency  2x / week    OT Duration  6 weeks    OT Treatment/Interventions  Self-care/ADL training;Therapeutic exercise;Patient/family education;Splinting;Paraffin;Fluidtherapy;Contrast Bath;Manual Therapy;Passive range of motion;Scar mobilization    Plan  assess progress with HEP    OT Home Exercise Plan  see pt instruction    Consulted and Agree with Plan of Care  Patient       Patient will benefit from skilled therapeutic intervention in order to improve the following deficits and impairments:   Body Structure / Function / Physical Skills: ADL, Flexibility, ROM, UE functional use, Decreased knowledge of precautions, FMC, Scar mobility, Edema, Pain, IADL, Strength       Visit Diagnosis: Stiffness of right wrist, not elsewhere classified  Muscle weakness (generalized)  Pain in right hand  Pain in right wrist  Other disturbances of skin sensation  Stiffness of right hand, not elsewhere classified    Problem List There are no problems to display for this patient.   Rosalyn Gess OTR/L,CLT 12/28/2019, 12:02 PM  [place] PHYSICAL AND SPORTS MEDICINE [address], [place], [address] Phone: [phone]   Fax:   [phone]  Name: Veronica Anthony MRN: [id number] Date of Birth: 1961-03-24

## 2019-12-30 ENCOUNTER — Other Ambulatory Visit: Payer: Self-pay

## 2019-12-30 ENCOUNTER — Ambulatory Visit: Payer: BC Managed Care – PPO | Admitting: Occupational Therapy

## 2019-12-30 DIAGNOSIS — M79641 Pain in right hand: Secondary | ICD-10-CM

## 2019-12-30 DIAGNOSIS — M6281 Muscle weakness (generalized): Secondary | ICD-10-CM

## 2019-12-30 DIAGNOSIS — M25631 Stiffness of right wrist, not elsewhere classified: Secondary | ICD-10-CM

## 2019-12-30 DIAGNOSIS — M25531 Pain in right wrist: Secondary | ICD-10-CM

## 2019-12-30 DIAGNOSIS — R208 Other disturbances of skin sensation: Secondary | ICD-10-CM

## 2019-12-30 DIAGNOSIS — M25641 Stiffness of right hand, not elsewhere classified: Secondary | ICD-10-CM

## 2019-12-30 NOTE — Patient Instructions (Signed)
Same HEP but increase to 3 sets of 1 lbs weight for all planes for wrist Add composite PROM to digits prior to composite fist AROM  And add this date light blue - easy putty for gripping 12 reps - 2 x day - but pain free  Focus with scar massage at distal end of scar where still adhere

## 2019-12-30 NOTE — Therapy (Signed)
[place] PHYSICAL AND SPORTS MEDICINE [address] [place], [address] Phone: [phone]   Fax:  [phone]  Occupational Therapy Treatment  Patient Details  Name: Veronica Anthony MRN: [id number] Date of Birth: 07-04-1961 No data recorded  Encounter Date: 12/30/2019  OT End of Session - 12/30/19 1402    Visit Number  7    Number of Visits  16    Date for OT Re-Evaluation  01/31/20    OT Start Time  1300    OT Stop Time  1358    OT Time Calculation (min)  58 min    Activity Tolerance  Patient tolerated treatment well    Behavior During Therapy  [facility] for tasks assessed/performed       Past Medical History:  Diagnosis Date  . Complication of anesthesia    vomiting   . GERD (gastroesophageal reflux disease)   . History of kidney stones    2015  . Sleep apnea    2011     Past Surgical History:  Procedure Laterality Date  . OPEN REDUCTION INTERNAL FIXATION (ORIF) DISTAL RADIAL FRACTURE Right 11/04/2019   Procedure: OPEN REDUCTION INTERNAL FIXATION (ORIF) DISTAL RADIAL FRACTURE;  Surgeon: Hessie Knows, MD;  Location: [facility] ORS;  Service: Orthopedics;  Laterality: Right;  . TUBAL LIGATION  1991    There were no vitals filed for this visit.  Subjective Assessment - 12/30/19 1311    Subjective   I am using it more - cooking and cleaning but very slow  - cannot lift heavy things, but  cutting is better. I started driving - today my 3rd time    Pertinent History  Pt fell on 2/10 - close reduction in ER was done - but then needed -  ORIF of the right distal radius. Date of surgery was 11/03/2018 - refer to hand therap because of stiffness and scar massage    Patient Stated Goals  I want to be able to use my R dominant hand and wrist so I can go back to work , work in yard, drive , cook and ride bike    Currently in Pain?  Yes    Pain Score  1     Pain Location  Hand    Pain Orientation  Right    Pain Descriptors / Indicators   Aching;Tightness    Pain Type  Surgical pain    Pain Onset  More than a month ago    Pain Frequency  Intermittent         [facility] OT Assessment - 12/30/19 0001      Strength   Right Hand Grip (lbs)  10    Right Hand Lateral Pinch  9 lbs    Right Hand 3 Point Pinch  8 lbs    Left Hand Grip (lbs)  40    Left Hand Lateral Pinch  12 lbs    Left Hand 3 Point Pinch  15 lbs               OT Treatments/Exercises (OP) - 12/30/19 0001      RUE Fluidotherapy   Number Minutes Fluidotherapy  8 Minutes    RUE Fluidotherapy Location  Hand;Wrist    Comments  AROM for wrist in all planes and digits         scar massage done - and pt to focus on distal part - scar mobs and MC and CT spreads - used graston tool nr 2 on  volar forearm and wrist - as well as in hand piror to AROM and AAROM   Focus on AAROM by OT  for tendon glides - pt to do [facility]  flexionat home and intrinsic fist  Done and review in length PROM for composite flexion prior to place and hold  Pt had hard time doing it - but with mirror was able to have pt understand   Pt to cont at home AAROM and PROM wrist flexion ,ext - prolonged flexion and extention stretch by OT this date  Pt to focus on flexion stretch for wrist - 3-4 x 30 sec to 1 min  Cont at home  AAROM over edge of table for flexion ,ext( pt had hard time to relax and feel stretch and NOT pain )  Cont at homeRD, UD PROM 10 reps done  Andcont withsupination wheel provided for pt to use for sup end range   CPM for wrist flexion , ext 200 sec - and repeat again wrist flexion on CPM 200 sec  And review again1 lbs for wrist in all planes - pain free  10 reps  Can increase to 3sets for 10  And light blue putty add for gripping this date -pain free 12 reps after tendon glides   All HEP 2 x day after contrast and cont with isotoner glove Provided new cica scar pad for night time under isotoner glove         OT Short Term Goals - 12/06/19 1814       OT SHORT TERM GOAL #1   Title  Pt to be Ind in HEP to increase digits AROM in flexion and extention to WNL    Baseline  extention of digits -15 at [facility] and PIP of 5th , flexion MC's 45-65 degrees , PIP's 90 to 100    Time  3    Period  Weeks    Status  New    Target Date  12/27/19      OT SHORT TERM GOAL #2   Title  Pt to be ind in HEP to increase AROM in R wrist to be able to wean out of splint and use hand in ADL's    Baseline  splint when up and about- wrist decrease flexion 22, ext 32 ,sup 75    Time  4    Period  Weeks    Status  New    Target Date  01/03/20        OT Long Term Goals - 12/06/19 1817      OT LONG TERM GOAL #1   Title  Pt AROM in R wrist increase in all planes more tan 75% compare to R wrist to use hand in more than 75% of function on PRWHE    Baseline  Pt only using hand in 16 % of function onPRWHE -and AROM decrease greatly - see flowsheet    Time  8    Period  Weeks    Status  New    Target Date  01/31/20      OT LONG TERM GOAL #2   Title  R grip and prehension strenght increase to more than 60% compare to L to cut food, hold plate and carry more than 5 lbs    Baseline  NT 5 1/2 wks/s/p - only using hand 16 % in function on PRHWE    Time  8    Period  Weeks    Status  New  Target Date  01/31/20      OT LONG TERM GOAL #3   Title  Function on PRWHE improve with more than 30 points    Baseline  function score on PRWHE 42/50    Time  8    Period  Weeks    Status  New    Target Date  01/31/20            Plan - 12/30/19 1403    Clinical Impression Statement  Pt is 7 1/2 wks s/p R distal radius fx with ORIF - pt show slow but steady progress - review and add composite flexion to digits- with focus on 4th and 5th - and light putty add gor grip strenght -but should be pain free - cont 1 lbs weight for wrist and to focus on distal part of scar where still adhere    OT Occupational Profile and History  Problem Focused Assessment - Including  review of records relating to presenting problem    Occupational performance deficits (Please refer to evaluation for details):  ADL's;IADL's;Play;Leisure;Work;Social Participation    Body Structure / Function / Physical Skills  ADL;Flexibility;ROM;UE functional use;Decreased knowledge of precautions;FMC;Scar mobility;Edema;Pain;IADL;Strength    Rehab Potential  Good    Clinical Decision Making  Limited treatment options, no task modification necessary    Comorbidities Affecting Occupational Performance:  None    Modification or Assistance to Complete Evaluation   No modification of tasks or assist necessary to complete eval    OT Frequency  2x / week    OT Duration  6 weeks    OT Treatment/Interventions  Self-care/ADL training;Therapeutic exercise;Patient/family education;Splinting;Paraffin;Fluidtherapy;Contrast Bath;Manual Therapy;Passive range of motion;Scar mobilization    Plan  assess progress with HEP    OT Home Exercise Plan  see pt instruction    Consulted and Agree with Plan of Care  Patient       Patient will benefit from skilled therapeutic intervention in order to improve the following deficits and impairments:   Body Structure / Function / Physical Skills: ADL, Flexibility, ROM, UE functional use, Decreased knowledge of precautions, FMC, Scar mobility, Edema, Pain, IADL, Strength       Visit Diagnosis: Stiffness of right wrist, not elsewhere classified  Muscle weakness (generalized)  Pain in right hand  Pain in right wrist  Other disturbances of skin sensation  Stiffness of right hand, not elsewhere classified    Problem List There are no problems to display for this patient.   Oletta Cohn OTR/l,CLT 12/30/2019, 2:06 PM  [facility] PHYSICAL AND SPORTS MEDICINE [address], [place], [address] Phone: [phone]   Fax:  [phone]  Name: Maddalynn Barnard MRN: [id number] Date of Birth: 1960-10-21

## 2020-01-03 ENCOUNTER — Other Ambulatory Visit: Payer: Self-pay

## 2020-01-03 ENCOUNTER — Ambulatory Visit: Payer: BC Managed Care – PPO | Admitting: Occupational Therapy

## 2020-01-03 DIAGNOSIS — M79641 Pain in right hand: Secondary | ICD-10-CM

## 2020-01-03 DIAGNOSIS — M25641 Stiffness of right hand, not elsewhere classified: Secondary | ICD-10-CM

## 2020-01-03 DIAGNOSIS — M25631 Stiffness of right wrist, not elsewhere classified: Secondary | ICD-10-CM

## 2020-01-03 DIAGNOSIS — M6281 Muscle weakness (generalized): Secondary | ICD-10-CM

## 2020-01-03 DIAGNOSIS — R208 Other disturbances of skin sensation: Secondary | ICD-10-CM

## 2020-01-03 DIAGNOSIS — M25531 Pain in right wrist: Secondary | ICD-10-CM

## 2020-01-03 NOTE — Therapy (Signed)
[place] [facility] PHYS[facility] AND SPORTS MEDICINE [address] [place], [address] Phone: [phone]   Fax:  [phone]  Occupational Therapy Treatment  Patient Details  Name: Veronica Anthony MRN: [id number] Date of Birth: 07-12-1961 No data recorded  Encounter Date: 01/03/2020  OT End of Session - 01/03/20 1632    Visit Number  8    Number of Visits  16    Date for OT Re-Evaluation  01/31/20    OT Start Time  1607    OT Stop Time  1704    OT Time Calculation (min)  57 min    Activity Tolerance  Patient tolerated treatment well    Behavior During Therapy  [facility] for tasks assessed/performed       Past Medical History:  Diagnosis Date  . Complication of anesthesia    vomiting   . GERD (gastroesophageal reflux disease)   . History of kidney stones    2015  . Sleep apnea    2011     Past Surgical History:  Procedure Laterality Date  . OPEN REDUCTION INTERNAL FIXATION (ORIF) DISTAL RADIAL FRACTURE Right 11/04/2019   Procedure: OPEN REDUCTION INTERNAL FIXATION (ORIF) DISTAL RADIAL FRACTURE;  Surgeon: Kennedy Bucker, MD;  Location: [facility] ORS;  Service: Orthopedics;  Laterality: Right;  . TUBAL LIGATION  1991    There were no vitals filed for this visit.  Subjective Assessment - 01/03/20 1630    Subjective   Look at my fingers - I can bend then now - the putty really help    Pertinent History  Pt fell on 2/10 - close reduction in ER was done - but then needed -  ORIF of the right distal radius. Date of surgery was 11/03/2018 - refer to hand therap because of stiffness and scar massage    Patient Stated Goals  I want to be able to use my R dominant hand and wrist so I can go back to work , work in yard, drive , cook and ride bike    Currently in Pain?  No/denies         [facility] OT Assessment - 01/03/20 0001      Strength   Right Hand Grip (lbs)  12    Right Hand Lateral Pinch  10 lbs    Right Hand 3 Point Pinch  10 lbs      Pt arrive  with increase flexion of digits- MC flexion still decrease during composite fist  Show increase grip , prehension -and report putty really helped          OT Treatments/Exercises (OP) - 01/03/20 0001      RUE Paraffin   Number Minutes Paraffin  10 Minutes    RUE Paraffin Location  Wrist    Comments  alternate 2 min flexion ,ext stretch      focus on flexion ,ext stretches of wrist during paraffin with heatingpad    scar massage done - and pt to focus on distal part - scar mobs done this date - xtractor, vibration with AROM of digits  Also done  [facility] and CT spreads - used graston tool nr 2 on volar forearm and wrist - as well as in hand piror to AROM and AAROM   Focus on PROM for MC flexion during composite fistting    PROM for composite flexion prior to place and hold Pt still having hard time doing it -  Pt to cont at home PROM wrist flexion ,ext -  prolonged flexion and extention stretch by OT this date and encourage for pt to do at home - for longer times - like 1-2 min   Cont at [facility] over edge of table for flexion ,ext( pt had hard time to relax and feel stretch and NOT pain )  Cont at homeRD, UD PROM 10 reps done  Andcont withsupination wheel provided for pt to use for sup end range  CPM for wrist flexion , ext 200 sec x 2 this date   And reviewagain1 lbs for wrist in all planes - pain free  10 reps Cont 3sets for 10 And cont  light blue putty add for gripping this date -2 x 15 reps pain free  All HEP 2 x day after contrast and cont with isotoner glove Provided new cica scar pad for night time under isotoner glove         OT Education - 01/03/20 1632    Education Details  HEP review - and upgraded    Person(s) Educated  Patient    Methods  Explanation;Demonstration;Tactile cues;Verbal cues;Handout    Comprehension  Returned demonstration;Verbalized understanding;Verbal cues required       OT Short Term Goals - 12/06/19 1814      OT  SHORT TERM GOAL #1   Title  Pt to be Ind in HEP to increase digits AROM in flexion and extention to WNL    Baseline  extention of digits -15 at [facility] and PIP of 5th , flexion MC's 45-65 degrees , PIP's 90 to 100    Time  3    Period  Weeks    Status  New    Target Date  12/27/19      OT SHORT TERM GOAL #2   Title  Pt to be ind in HEP to increase AROM in R wrist to be able to wean out of splint and use hand in ADL's    Baseline  splint when up and about- wrist decrease flexion 22, ext 32 ,sup 75    Time  4    Period  Weeks    Status  New    Target Date  01/03/20        OT Long Term Goals - 12/06/19 1817      OT LONG TERM GOAL #1   Title  Pt AROM in R wrist increase in all planes more tan 75% compare to R wrist to use hand in more than 75% of function on PRWHE    Baseline  Pt only using hand in 16 % of function onPRWHE -and AROM decrease greatly - see flowsheet    Time  8    Period  Weeks    Status  New    Target Date  01/31/20      OT LONG TERM GOAL #2   Title  R grip and prehension strenght increase to more than 60% compare to L to cut food, hold plate and carry more than 5 lbs    Baseline  NT 5 1/2 wks/s/p - only using hand 16 % in function on PRHWE    Time  8    Period  Weeks    Status  New    Target Date  01/31/20      OT LONG TERM GOAL #3   Title  Function on PRWHE improve with more than 30 points    Baseline  function score on PRWHE 42/50    Time  8    Period  Weeks  Status  New    Target Date  01/31/20            Plan - 01/03/20 1632    Clinical Impression Statement  Pt is 8 wks s/p R distal radius fx wtih ORIF - pt show progress this date in flexion of digits , sup and increase strength - but progress in flexion , ext slow - reinforce for pt to do extended stretches for flexoin ,ext - focus on scar massage to distal scar and MC flexion during composite fist    OT Occupational Profile and History  Problem Focused Assessment - Including review of records  relating to presenting problem    Occupational performance deficits (Please refer to evaluation for details):  ADL's;IADL's;Play;Leisure;Work;Social Participation    Body Structure / Function / Physical Skills  ADL;Flexibility;ROM;UE functional use;Decreased knowledge of precautions;FMC;Scar mobility;Edema;Pain;IADL;Strength    Rehab Potential  Good    Clinical Decision Making  Limited treatment options, no task modification necessary    Comorbidities Affecting Occupational Performance:  None    Modification or Assistance to Complete Evaluation   No modification of tasks or assist necessary to complete eval    OT Frequency  2x / week    OT Duration  6 weeks    OT Treatment/Interventions  Self-care/ADL training;Therapeutic exercise;Patient/family education;Splinting;Paraffin;Fluidtherapy;Contrast Bath;Manual Therapy;Passive range of motion;Scar mobilization    Plan  assess progress with HEP    OT Home Exercise Plan  see pt instruction    Consulted and Agree with Plan of Care  Patient       Patient will benefit from skilled therapeutic intervention in order to improve the following deficits and impairments:   Body Structure / Function / Physical Skills: ADL, Flexibility, ROM, UE functional use, Decreased knowledge of precautions, FMC, Scar mobility, Edema, Pain, IADL, Strength       Visit Diagnosis: Stiffness of right wrist, not elsewhere classified  Muscle weakness (generalized)  Pain in right hand  Pain in right wrist  Other disturbances of skin sensation  Stiffness of right hand, not elsewhere classified    Problem List There are no problems to display for this patient.   Oletta Cohn OTR/l,CLT 01/03/2020, 5:09 PM  [place] [facility] PHYSICAL AND SPORTS MEDICINE [address], [place], [address] Phone: [phone]   Fax:  [phone]  Name: Veronica Anthony MRN: [id number] Date of Birth: May 10, 1961

## 2020-01-03 NOTE — Patient Instructions (Signed)
Focus on MC flexion during composite flexion ; focus on wrist flexion , ext stretches - and scar massage  And scapula squeezes , shoulder scaption to 90 degrees with scapula on wall  Ice to shoulders

## 2020-01-05 ENCOUNTER — Other Ambulatory Visit: Payer: Self-pay

## 2020-01-05 ENCOUNTER — Ambulatory Visit: Payer: BC Managed Care – PPO | Admitting: Occupational Therapy

## 2020-01-05 DIAGNOSIS — M25631 Stiffness of right wrist, not elsewhere classified: Secondary | ICD-10-CM | POA: Diagnosis not present

## 2020-01-05 DIAGNOSIS — M25531 Pain in right wrist: Secondary | ICD-10-CM

## 2020-01-05 DIAGNOSIS — M25641 Stiffness of right hand, not elsewhere classified: Secondary | ICD-10-CM

## 2020-01-05 DIAGNOSIS — M6281 Muscle weakness (generalized): Secondary | ICD-10-CM

## 2020-01-05 DIAGNOSIS — M79641 Pain in right hand: Secondary | ICD-10-CM

## 2020-01-05 DIAGNOSIS — R208 Other disturbances of skin sensation: Secondary | ICD-10-CM

## 2020-01-05 NOTE — Patient Instructions (Signed)
Same focus that last time

## 2020-01-05 NOTE — Therapy (Signed)
[place] PH[facility]ICAL AND SPORTS MEDICINE [address], [place], [address] Phone: [phone]   Fax:  [phone]  Occupational Therapy Treatment  Patient Details  Name: Veronica Anthony MRN: [id number] Date of Birth: [birth year] No data recorded  Encounter Date: 01/05/2020  OT End of Session - 01/05/20 1237    Visit Number  9    Number of Visits  16    Date for OT Re-Evaluation  01/31/20    OT Start Time  1217    OT Stop Time  1301    OT Time Calculation (min)  44 min    Activity Tolerance  Patient tolerated treatment well    Behavior During Therapy  [facility] for tasks assessed/performed       Past Medical History:  Diagnosis Date  . Complication of anesthesia    vomiting   . GERD (gastroesophageal reflux disease)   . History of kidney stones    2015  . Sleep apnea    2011     Past Surgical History:  Procedure Laterality Date  . OPEN REDUCTION INTERNAL FIXATION (ORIF) DISTAL RADIAL FRACTURE Right 11/04/2019   Procedure: OPEN REDUCTION INTERNAL FIXATION (ORIF) DISTAL RADIAL FRACTURE;  Surgeon: Hessie Knows, MD;  Location: [facility] ORS;  Service: Orthopedics;  Laterality: Right;  . TUBAL LIGATION  1991    There were no vitals filed for this visit.  Subjective Assessment - 01/05/20 1234    Subjective   My middle finger was little stiff this am - but I used my hand so much yesterday - cooking- it felt so good    Pertinent History  Pt fell on 2/10 - close reduction in ER was done - but then needed -  ORIF of the right distal radius. Date of surgery was 11/03/2018 - refer to hand therap because of stiffness and scar massage    Patient Stated Goals  I want to be able to use my R dominant hand and wrist so I can go back to work , work in yard, drive , cook and ride bike    Currently in Pain?  Yes    Pain Score  1     Pain Location  Hand    Pain Orientation  Right    Pain Descriptors / Indicators  Aching;Tightness    Pain Type  Surgical  pain    Pain Onset  More than a month ago    Pain Frequency  Intermittent                   OT Treatments/Exercises (OP) - 01/05/20 0001      RUE Paraffin   Number Minutes Paraffin  8 Minutes    RUE Paraffin Location  Wrist    Comments  alternate 2 min flexion ,ext stretches alternate        focus on flexion ,ext stretches of wrist during paraffin with heatingpad    scar massage done - and pt to focus on distal part - scar mobs done this date -   Focus on PROM for MC flexion during composite fistting    PROM forcomposite flexion prior to place and hold Pt still having hard time doing it composite  Pt to cont at [facility] wrist flexion ,ext - prolonged flexion and extention stretch by OT this date and encourage for pt to do at home - for longer times - like 1-2 min  - review again and explain for pt what to feel   Cont  at [facility] over edge of table for flexion ,ext( pt had hard time to relax and feel stretch and NOT pain )  Cont at homeRD, UD PROM 10 reps done  Andcont withsupination wheel provided for pt to use for sup end range  CPM for wrist flexion , ext 200 sec x 2 this date again  Flexion 55 this date and extention 50 - after CPM  Upgrade and done 2 lbs for RD, UD , sup , pron - 1 set of 12 - 2 x day   but to stay at 1 lbs wrist flexion , extention   10 reps Cont3sets for 10 And cont  light blue putty gripping  -2 x 15 reps pain free  All HEP 2 x day after contrast and cont with isotoner glove Provided new cica scar pad for night time under isotoner glove        OT Education - 01/05/20 1237    Education Details  scar distally , stretch for wrist flexion , ext , and 2 lbs for RD, UD, sup ,pro    Person(s) Educated  Patient    Methods  Explanation;Demonstration;Tactile cues;Verbal cues;Handout    Comprehension  Returned demonstration;Verbalized understanding;Verbal cues required       OT Short Term Goals - 12/06/19 1814       OT SHORT TERM GOAL #1   Title  Pt to be Ind in HEP to increase digits AROM in flexion and extention to WNL    Baseline  extention of digits -15 at [facility] and PIP of 5th , flexion MC's 45-65 degrees , PIP's 90 to 100    Time  3    Period  Weeks    Status  New    Target Date  12/27/19      OT SHORT TERM GOAL #2   Title  Pt to be ind in HEP to increase AROM in R wrist to be able to wean out of splint and use hand in ADL's    Baseline  splint when up and about- wrist decrease flexion 22, ext 32 ,sup 75    Time  4    Period  Weeks    Status  New    Target Date  01/03/20        OT Long Term Goals - 12/06/19 1817      OT LONG TERM GOAL #1   Title  Pt AROM in R wrist increase in all planes more tan 75% compare to R wrist to use hand in more than 75% of function on PRWHE    Baseline  Pt only using hand in 16 % of function onPRWHE -and AROM decrease greatly - see flowsheet    Time  8    Period  Weeks    Status  New    Target Date  01/31/20      OT LONG TERM GOAL #2   Title  R grip and prehension strenght increase to more than 60% compare to L to cut food, hold plate and carry more than 5 lbs    Baseline  NT 5 1/2 wks/s/p - only using hand 16 % in function on PRHWE    Time  8    Period  Weeks    Status  New    Target Date  01/31/20      OT LONG TERM GOAL #3   Title  Function on PRWHE improve with more than 30 points    Baseline  function score on  PRWHE 42/50    Time  8    Period  Weeks    Status  New    Target Date  01/31/20            Plan - 01/05/20 1238    Clinical Impression Statement  Pt is 8 1/2 wks s/p R Distal radius fx with ORIF - pt show progress in fisting and strength at wrist - icnrease functional use - but stil limited in composite fist , distal scar tissue adhesions, and wrist flexion , ext    OT Occupational Profile and History  Problem Focused Assessment - Including review of records relating to presenting problem    Occupational performance deficits  (Please refer to evaluation for details):  ADL's;IADL's;Play;Leisure;Work;Social Participation    Body Structure / Function / Physical Skills  ADL;Flexibility;ROM;UE functional use;Decreased knowledge of precautions;FMC;Scar mobility;Edema;Pain;IADL;Strength    Rehab Potential  Good    Clinical Decision Making  Limited treatment options, no task modification necessary    Comorbidities Affecting Occupational Performance:  None    Modification or Assistance to Complete Evaluation   No modification of tasks or assist necessary to complete eval    OT Frequency  2x / week    OT Duration  6 weeks    OT Treatment/Interventions  Self-care/ADL training;Therapeutic exercise;Patient/family education;Splinting;Paraffin;Fluidtherapy;Contrast Bath;Manual Therapy;Passive range of motion;Scar mobilization    Plan  assess progress with HEP    OT Home Exercise Plan  see pt instruction    Consulted and Agree with Plan of Care  Patient       Patient will benefit from skilled therapeutic intervention in order to improve the following deficits and impairments:   Body Structure / Function / Physical Skills: ADL, Flexibility, ROM, UE functional use, Decreased knowledge of precautions, FMC, Scar mobility, Edema, Pain, IADL, Strength       Visit Diagnosis: Stiffness of right wrist, not elsewhere classified  Muscle weakness (generalized)  Pain in right wrist  Other disturbances of skin sensation  Pain in right hand  Stiffness of right hand, not elsewhere classified    Problem List There are no problems to display for this patient.   Oletta Cohn OTR/L,CLT 01/05/2020, 1:45 PM  [place] [facility] PHYSICAL AND SPORTS MEDICINE [address], [place], [address] Phone: [phone]   Fax:  [phone]  Name: Veronica Anthony MRN: [id number] Date of Birth: Apr 06, 1961

## 2020-01-11 ENCOUNTER — Ambulatory Visit: Payer: BC Managed Care – PPO | Admitting: Occupational Therapy

## 2020-01-11 ENCOUNTER — Other Ambulatory Visit: Payer: Self-pay

## 2020-01-11 DIAGNOSIS — M25641 Stiffness of right hand, not elsewhere classified: Secondary | ICD-10-CM

## 2020-01-11 DIAGNOSIS — M6281 Muscle weakness (generalized): Secondary | ICD-10-CM

## 2020-01-11 DIAGNOSIS — M25631 Stiffness of right wrist, not elsewhere classified: Secondary | ICD-10-CM

## 2020-01-11 DIAGNOSIS — M79641 Pain in right hand: Secondary | ICD-10-CM

## 2020-01-11 DIAGNOSIS — R208 Other disturbances of skin sensation: Secondary | ICD-10-CM

## 2020-01-11 DIAGNOSIS — M25531 Pain in right wrist: Secondary | ICD-10-CM

## 2020-01-11 NOTE — Therapy (Signed)
[facility] [address], [place], [address] Phone: [phone]   Fax:  [phone]  Occupational Therapy Treatment  Patient Details  Name: Veronica Anthony MRN: [id number] Date of Birth: 16-Jan-1961 No data recorded  Encounter Date: 01/11/2020  OT End of Session - 01/11/20 1221    Visit Number  10    Number of Visits  16    Date for OT Re-Evaluation  01/31/20    OT Start Time  1115    OT Stop Time  1209    OT Time Calculation (min)  54 min    Activity Tolerance  Patient tolerated treatment well    Behavior During Therapy  [facility] for tasks assessed/performed       Past Medical History:  Diagnosis Date  . Complication of anesthesia    vomiting   . GERD (gastroesophageal reflux disease)   . History of kidney stones    2015  . Sleep apnea    2011     Past Surgical History:  Procedure Laterality Date  . OPEN REDUCTION INTERNAL FIXATION (ORIF) DISTAL RADIAL FRACTURE Right 11/04/2019   Procedure: OPEN REDUCTION INTERNAL FIXATION (ORIF) DISTAL RADIAL FRACTURE;  Surgeon: Kennedy Bucker, MD;  Location: [facility] ORS;  Service: Orthopedics;  Laterality: Right;  . TUBAL LIGATION  1991    There were no vitals filed for this visit.  Subjective Assessment - 01/11/20 1219    Subjective   My middle finger bothers me -and get stiff - I stopped like you told me with the putty - but my shoulder really hurts - at night the worse and when reaching out and over head    Pertinent History  Pt fell on 2/10 - close reduction in ER was done - but then needed -  ORIF of the right distal radius. Date of surgery was 11/03/2018 - refer to hand therap because of stiffness and scar massage    Patient Stated Goals  I want to be able to use my R dominant hand and wrist so I can go back to work , work in yard, drive , cook and ride bike    Currently in Pain?  Yes    Pain Score  3     Pain Location  Shoulder    Pain Orientation  Right     Pain Descriptors / Indicators  Aching;Tightness    Pain Type  Acute pain;Surgical pain    Pain Onset  More than a month ago    Pain Frequency  Intermittent    Aggravating Factors   reaching over head , out to side and night time         [facility] OT Assessment - 01/11/20 0001      AROM   Right Forearm Pronation  90 Degrees    Right Forearm Supination  90 Degrees    Right Wrist Extension  50 Degrees   in session   Right Wrist Flexion  42 Degrees   in session   Right Wrist Radial Deviation  20 Degrees    Right Wrist Ulnar Deviation  20 Degrees      Strength   Right Hand Grip (lbs)  15    Right Hand Lateral Pinch  10 lbs    Right Hand 3 Point Pinch  10 lbs    Left Hand Grip (lbs)  40    Left Hand Lateral Pinch  12 lbs    Left Hand 3 Point Pinch  15 lbs  measurements taken for AROM wrist and grip /prehenison   digits extention and flexion improve but reinforce composite flexion of 3rd thru 5th  Had hard time to understand - but this date after extended education - was able to correctly show   with scar massage- to focus on distal part - done this date scar massage -suing xtractor and vibration  Also pt to focus and done extended wrist flexion stretch - pt guarding usually - but able to demo correctly this date  Pt do report some stiffness at 3rd digit after over doing last week her putty- to hold off on putty - and was tender over A1 pulley at 3rd - Dr Rosita Kea to check please   Pt to focus on wrist flexion , extended stretches - extended time 30 sec to 1 min x 3 - 3-5 x day  And composite flexion of digits   PT screen pt R shoulder pain and stiffness - pt the last 2 wks complaining of shoulder more than wrist   - pain at night time and reaching out to side , over head and into extention  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 and trigger point on lateral deltoid - doing some scapula squeezes - but  would recommend PT eval and tx at this time please                   OT Education - 01/11/20 1221    Education Details  HEP change and what to focus on    Person(s) Educated  Patient    Methods  Explanation;Demonstration;Tactile cues;Verbal cues;Handout    Comprehension  Returned demonstration;Verbalized understanding;Verbal cues required       OT Short Term Goals - 12/06/19 1814      OT SHORT TERM GOAL #1   Title  Pt to be Ind in HEP to increase digits AROM in flexion and extention to WNL    Baseline  extention of digits -15 at [facility] and PIP of 5th , flexion MC's 45-65 degrees , PIP's 90 to 100    Time  3    Period  Weeks    Status  New    Target Date  12/27/19      OT SHORT TERM GOAL #2   Title  Pt to be ind in HEP to increase AROM in R wrist to be able to wean out of splint and use hand in ADL's    Baseline  splint when up and about- wrist decrease flexion 22, ext 32 ,sup 75    Time  4    Period  Weeks    Status  New    Target Date  01/03/20        OT Long Term Goals - 12/06/19 1817      OT LONG TERM GOAL #1   Title  Pt AROM in R wrist increase in all planes more tan 75% compare to R wrist to use hand in more than 75% of function on PRWHE    Baseline  Pt only using hand in 16 % of function onPRWHE -and AROM decrease greatly - see flowsheet    Time  8    Period  Weeks    Status  New    Target Date  01/31/20      OT LONG TERM GOAL #2   Title  R grip and prehension strenght increase to more than 60% compare to L to cut food, hold plate and  carry more than 5 lbs    Baseline  NT 5 1/2 wks/s/p - only using hand 16 % in function on PRHWE    Time  8    Period  Weeks    Status  New    Target Date  01/31/20      OT LONG TERM GOAL #3   Title  Function on PRWHE improve with more than 30 points    Baseline  function score on PRWHE 42/50    Time  8    Period  Weeks    Status  New    Target Date  01/31/20            Plan - 01/11/20 1222    Clinical Impression Statement  Pt is 9 1/2 wks s/p R distal  radius fx with ORIF - pt made great progress from [facility] in edema , digits AROM , wrist sup , RD, UD - but had a lot of anxiety at the [facility] and about going back to work - her shoulder bothers her at nigh time and over head /reaching out to the side - has hard time doing compostie flexion for digits, wrist flexion - pt to focus this week on scar adhesion,  PROM /stretches for wrist flexion , ext and composite digits flexion - Dr Rudene Christians to look at [place] at 3rd digit please and would recommend PT eval and tx for R shoulder stiffness and pain at night time and AROM - 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 AROM and grip strength    OT Occupational Profile and History  Problem Focused Assessment - Including review of records relating to presenting problem    Occupational performance deficits (Please refer to evaluation for details):  ADL's;IADL's;Play;Leisure;Work;Social Participation    Body Structure / Function / Physical Skills  ADL;Flexibility;ROM;UE functional use;Decreased knowledge of precautions;FMC;Scar mobility;Edema;Pain;IADL;Strength    Rehab Potential  Good    Clinical Decision Making  Limited treatment options, no task modification necessary    Comorbidities Affecting Occupational Performance:  None    Modification or Assistance to Complete Evaluation   No modification of tasks or assist necessary to complete eval    OT Frequency  2x / week    OT Duration  6 weeks    OT Treatment/Interventions  Self-care/ADL training;Therapeutic exercise;Patient/family education;Splinting;Paraffin;Fluidtherapy;Contrast Bath;Manual Therapy;Passive range of motion;Scar mobilization    Plan  assess progress with HEP    OT Home Exercise Plan  see pt instruction    Consulted and Agree with Plan of Care  Patient       Patient will benefit from skilled therapeutic intervention in order to improve the following deficits and impairments:   Body Structure / Function / Physical Skills: ADL,  Flexibility, ROM, UE functional use, Decreased knowledge of precautions, FMC, Scar mobility, Edema, Pain, IADL, Strength       Visit Diagnosis: Stiffness of right wrist, not elsewhere classified  Muscle weakness (generalized)  Pain in right wrist  Other disturbances of skin sensation  Pain in right hand  Stiffness of right hand, not elsewhere classified    Problem List There are no problems to display for this patient.   Rosalyn Gess OTR/L,CLT 01/11/2020, 12:26 PM  [place] [facility] SPORTS MEDICINE [address] [place], [address] Phone: [phone]   Fax:  [phone]  Name: Veronica Anthony MRN: [id number] Date of Birth: 08-12-1961

## 2020-01-11 NOTE — Patient Instructions (Signed)
See note

## 2020-01-13 ENCOUNTER — Ambulatory Visit: Payer: BC Managed Care – PPO | Admitting: Occupational Therapy

## 2020-01-13 ENCOUNTER — Other Ambulatory Visit: Payer: Self-pay

## 2020-01-13 DIAGNOSIS — M6281 Muscle weakness (generalized): Secondary | ICD-10-CM

## 2020-01-13 DIAGNOSIS — M25531 Pain in right wrist: Secondary | ICD-10-CM

## 2020-01-13 DIAGNOSIS — M25631 Stiffness of right wrist, not elsewhere classified: Secondary | ICD-10-CM

## 2020-01-13 DIAGNOSIS — R208 Other disturbances of skin sensation: Secondary | ICD-10-CM

## 2020-01-13 DIAGNOSIS — M25641 Stiffness of right hand, not elsewhere classified: Secondary | ICD-10-CM

## 2020-01-13 DIAGNOSIS — M79641 Pain in right hand: Secondary | ICD-10-CM

## 2020-01-13 NOTE — Therapy (Signed)
[place] [facility]TS [facility]IC[facility] [address], [place], [address] Phone: [phone]   Fax:  [phone]  Occupational Therapy Treatment  Patient Details  Name: Veronica Anthony MRN: [id number] Date of Birth: 1961/09/09 No data recorded  Encounter Date: 01/13/2020  OT End of Session - 01/13/20 1216    Visit Number  11    Number of Visits  16    Date for OT Re-Evaluation  01/31/20    OT Start Time  1112    OT Stop Time  1209    OT Time Calculation (min)  57 min    Activity Tolerance  Patient tolerated treatment well    Behavior During Therapy  [facility] for tasks assessed/performed       Past Medical History:  Diagnosis Date  . Complication of anesthesia    vomiting   . GERD (gastroesophageal reflux disease)   . History of kidney stones    2015  . Sleep apnea    2011     Past Surgical History:  Procedure Laterality Date  . OPEN REDUCTION INTERNAL FIXATION (ORIF) DISTAL RADIAL FRACTURE Right 11/04/2019   Procedure: OPEN REDUCTION INTERNAL FIXATION (ORIF) DISTAL RADIAL FRACTURE;  Surgeon: Hessie Knows, MD;  Location: [facility] ORS;  Service: Orthopedics;  Laterality: Right;  . TUBAL LIGATION  1991    There were no vitals filed for this visit.  Subjective Assessment - 01/13/20 1214    Subjective   I did not see the Dr yesterday - he was out of the office - they are going to reschedule - did what you told me - my scar is better I think    Pertinent History  Pt fell on 2/10 - close reduction in ER was done - but then needed -  ORIF of the right distal radius. Date of surgery was 11/03/2018 - refer to hand therap because of stiffness and scar massage    Patient Stated Goals  I want to be able to use my R dominant hand and wrist so I can go back to work , work in yard, drive , cook and ride bike    Currently in Pain?  No/denies         [facility] OT Assessment - 01/13/20 0001      AROM   Right Wrist Extension  54 Degrees    Right  Wrist Flexion  40 Degrees               OT Treatments/Exercises (OP) - 01/13/20 0001      RUE Paraffin   Number Minutes Paraffin  8 Minutes    RUE Paraffin Location  Wrist    Comments  2 min stretch for wrist extention x 3 and 2 x 1 min ext        measurements taken for AROM wrist flexion ,ext-  5th MC 70 and 4th 80 degrees    Had hard time to understand - but this date after extended education gain - was able to correctly show passive stretches    with scar massage- to focus on distal part - done this date scar massage -graston tool nr 2 for sweeping on volar and dorsal forearm and wrist - and palm over 3rd ,4th and 5th digits   focus wrist flexion stretch 2 min x 3, extention 1 min x 2  - pt guarding usually - but able to demo correctly this date and OT done this date in clinic - increase to 55 in  session  Pt do report some stiffness at 3rd digit after over doing last week her putty- to hold off on putty - and was tender over A1 pulley at 3rd- done some soft tissue and to do composite flexion of digits stretch on 3rd thru 5th   Pt to focus on wrist flexion , extended stretches - And composite flexion of digits And can do again 2 lbs for wrist ext place and hold, flexion 10 reps    PT screen pt R shoulder pain and stiffness last session  - pt the last 2 wks complaining of shoulder more than wrist   - pain at night time and reaching out to side , over head and into extention  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 and trigger point on lateral deltoid - doing some scapula squeezes - but  would recommend PT eval and tx at this time please        OT Education - 01/13/20 1216    Education Details  HEP change and what to focus on    Person(s) Educated  Patient    Methods  Explanation;Demonstration;Tactile cues;Verbal cues;Handout    Comprehension  Returned demonstration;Verbalized understanding;Verbal cues required       OT Short Term Goals -  12/06/19 1814      OT SHORT TERM GOAL #1   Title  Pt to be Ind in HEP to increase digits AROM in flexion and extention to WNL    Baseline  extention of digits -15 at [facility] and PIP of 5th , flexion MC's 45-65 degrees , PIP's 90 to 100    Time  3    Period  Weeks    Status  New    Target Date  12/27/19      OT SHORT TERM GOAL #2   Title  Pt to be ind in HEP to increase AROM in R wrist to be able to wean out of splint and use hand in ADL's    Baseline  splint when up and about- wrist decrease flexion 22, ext 32 ,sup 75    Time  4    Period  Weeks    Status  New    Target Date  01/03/20        OT Long Term Goals - 12/06/19 1817      OT LONG TERM GOAL #1   Title  Pt AROM in R wrist increase in all planes more tan 75% compare to R wrist to use hand in more than 75% of function on PRWHE    Baseline  Pt only using hand in 16 % of function onPRWHE -and AROM decrease greatly - see flowsheet    Time  8    Period  Weeks    Status  New    Target Date  01/31/20      OT LONG TERM GOAL #2   Title  R grip and prehension strenght increase to more than 60% compare to L to cut food, hold plate and carry more than 5 lbs    Baseline  NT 5 1/2 wks/s/p - only using hand 16 % in function on PRHWE    Time  8    Period  Weeks    Status  New    Target Date  01/31/20      OT LONG TERM GOAL #3   Title  Function on PRWHE improve with more than 30 points    Baseline  function score  on PRWHE 42/50    Time  8    Period  Weeks    Status  New    Target Date  01/31/20            Plan - 01/13/20 1216    Clinical Impression Statement  Pt is 10 wks s/p R distal radius fx with ORIF - pt to focus on composite flexion stretch 4th and 5th , wrist ext and flexion - did show improvement in wrist ext - but flexion same and composite fist- ed pt again on stretches and increase wrist flexion time for stretching and can do 2 lbs weight again    OT Occupational Profile and History  Problem Focused Assessment -  Including review of records relating to presenting problem    Occupational performance deficits (Please refer to evaluation for details):  ADL's;IADL's;Play;Leisure;Work;Social Participation    Body Structure / Function / Physical Skills  ADL;Flexibility;ROM;UE functional use;Decreased knowledge of precautions;FMC;Scar mobility;Edema;Pain;IADL;Strength    Rehab Potential  Good    Clinical Decision Making  Limited treatment options, no task modification necessary    Comorbidities Affecting Occupational Performance:  None    Modification or Assistance to Complete Evaluation   No modification of tasks or assist necessary to complete eval    OT Frequency  2x / week    OT Duration  4 weeks    OT Treatment/Interventions  Self-care/ADL training;Therapeutic exercise;Patient/family education;Splinting;Paraffin;Fluidtherapy;Contrast Bath;Manual Therapy;Passive range of motion;Scar mobilization    Plan  assess progress with HEP    OT Home Exercise Plan  see pt instruction    Consulted and Agree with Plan of Care  Patient       Patient will benefit from skilled therapeutic intervention in order to improve the following deficits and impairments:   Body Structure / Function / Physical Skills: ADL, Flexibility, ROM, UE functional use, Decreased knowledge of precautions, FMC, Scar mobility, Edema, Pain, IADL, Strength       Visit Diagnosis: Stiffness of right wrist, not elsewhere classified  Muscle weakness (generalized)  Other disturbances of skin sensation  Pain in right hand  Stiffness of right hand, not elsewhere classified  Pain in right wrist    Problem List There are no problems to display for this patient.   Oletta Cohn OTR/l,CLT 01/13/2020, 12:18 PM  [facility] PHYS[facility] AND SPORTS MEDICINE [address] [place], [address] Phone: [phone]   Fax:  [phone]  Name: Veronica Anthony MRN: [id number] Date of Birth: 1961-01-27

## 2020-01-13 NOTE — Patient Instructions (Signed)
See note

## 2020-01-17 ENCOUNTER — Ambulatory Visit: Payer: BC Managed Care – PPO | Attending: Orthopedic Surgery | Admitting: Occupational Therapy

## 2020-01-17 ENCOUNTER — Other Ambulatory Visit: Payer: Self-pay

## 2020-01-17 ENCOUNTER — Ambulatory Visit: Payer: BC Managed Care – PPO

## 2020-01-17 DIAGNOSIS — M25641 Stiffness of right hand, not elsewhere classified: Secondary | ICD-10-CM | POA: Diagnosis present

## 2020-01-17 DIAGNOSIS — M25531 Pain in right wrist: Secondary | ICD-10-CM | POA: Diagnosis present

## 2020-01-17 DIAGNOSIS — M25511 Pain in right shoulder: Secondary | ICD-10-CM

## 2020-01-17 DIAGNOSIS — M6281 Muscle weakness (generalized): Secondary | ICD-10-CM

## 2020-01-17 DIAGNOSIS — M5413 Radiculopathy, cervicothoracic region: Secondary | ICD-10-CM

## 2020-01-17 DIAGNOSIS — R209 Unspecified disturbances of skin sensation: Secondary | ICD-10-CM | POA: Diagnosis present

## 2020-01-17 DIAGNOSIS — M79641 Pain in right hand: Secondary | ICD-10-CM

## 2020-01-17 DIAGNOSIS — M25631 Stiffness of right wrist, not elsewhere classified: Secondary | ICD-10-CM

## 2020-01-17 DIAGNOSIS — R208 Other disturbances of skin sensation: Secondary | ICD-10-CM

## 2020-01-17 NOTE — Patient Instructions (Signed)
See note

## 2020-01-17 NOTE — Therapy (Signed)
[facility] [address] [place], [address] Phone: [phone]   Fax:  [phone]  Occupational Therapy Treatment  Patient Details  Name: Veronica Anthony MRN: [id number] Date of Birth: 1961/05/15 No data recorded  Encounter Date: 01/17/2020  OT End of Session - 01/17/20 1246    Visit Number  12    Number of Visits  16    Date for OT Re-Evaluation  01/31/20    OT Start Time  0801    OT Stop Time  0847    OT Time Calculation (min)  46 min    Activity Tolerance  Patient tolerated treatment well       Past Medical History:  Diagnosis Date  . Complication of anesthesia    vomiting   . GERD (gastroesophageal reflux disease)   . History of kidney stones    2015  . Sleep apnea    2011     Past Surgical History:  Procedure Laterality Date  . OPEN REDUCTION INTERNAL FIXATION (ORIF) DISTAL RADIAL FRACTURE Right 11/04/2019   Procedure: OPEN REDUCTION INTERNAL FIXATION (ORIF) DISTAL RADIAL FRACTURE;  Surgeon: Hessie Knows, MD;  Location: [facility] ORS;  Service: Orthopedics;  Laterality: Right;  . TUBAL LIGATION  1991    There were no vitals filed for this visit.  Subjective Assessment - 01/17/20 0821    Subjective   Used it more this weekend again - but then the next day my hand and wrist stiff - my shoulder little better    Pertinent History  Pt fell on 2/10 - close reduction in ER was done - but then needed -  ORIF of the right distal radius. Date of surgery was 11/03/2018 - refer to hand therap because of stiffness and scar massage    Patient Stated Goals  I want to be able to use my R dominant hand and wrist so I can go back to work , work in yard, drive , cook and ride bike    Currently in Pain?  Yes    Pain Score  4     Pain Location  Shoulder    Pain Orientation  Right    Pain Descriptors / Indicators  Aching;Tightness    Pain Type  Acute pain;Surgical pain    Pain Onset  1 to 4 weeks ago    Pain  Frequency  Intermittent    Aggravating Factors   Reaching over head, out to side and down         [facility] OT Assessment - 01/17/20 0001      AROM   Right Wrist Extension  58 Degrees    Right Wrist Flexion  40 Degrees      Strength   Right Hand Grip (lbs)  18    Left Hand Grip (lbs)  40       Pt making progress in wrist extention , strength , pain and stiffness in 3rd digit- and scar adhesion  But Flexion of wrist and composite flexion of 4th and 5th - same          OT Treatments/Exercises (OP) - 01/17/20 0001      RUE Paraffin   Number Minutes Paraffin  8 Minutes    RUE Paraffin Location  Wrist    Comments  2 min few time with heatinpad for wrist flxexion stretch         measurements taken for AROM wrist flexion - same- Extention improved 5th MC 70 and  4th 80 degrees same    Had hard time to understand - but this date after extended education again - was able to correctly show passive stretches for flexion and composite flexion for 4th and 5th MC    with scar massage- to focus on distal part - done this date scar massage -graston tool nr 2 for sweeping on volar and dorsal forearm and wrist - and palm over 3rd ,4th and 5th digits  prior to stretches   focus wrist flexion stretch 2 min x 3, extention 1 min x 2  - pt guarding at times  - but able to demo correctly this date and OT done this date in clinic - increase to 50 in session for wrist flexion  Less tenderness over 3rd A1pulley-and decrease stiffness or pain  Pt to focus on wrist flexion , extended stretches - And composite flexion of digits 4th and 5th  And can do again 2 lbs for wrist flexion 10 reps , place and hold extention   PT screened last week her  R shoulder pain and stiffness last session  - pt the last 2 wks complaining of shoulder more than wrist  - pain at night time and reaching out to side , over head and into extention  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 and  trigger point on lateral deltoid - doing some scapula squeezes - but would recommend PT eval and tx at this time please        OT Education - 01/17/20 1246    Education Details  HEP change and what to focus on    Person(s) Educated  Patient    Methods  Explanation;Demonstration;Tactile cues;Verbal cues;Handout    Comprehension  Returned demonstration;Verbalized understanding;Verbal cues required       OT Short Term Goals - 12/06/19 1814      OT SHORT TERM GOAL #1   Title  Pt to be Ind in HEP to increase digits AROM in flexion and extention to WNL    Baseline  extention of digits -15 at [facility] and PIP of 5th , flexion MC's 45-65 degrees , PIP's 90 to 100    Time  3    Period  Weeks    Status  New    Target Date  12/27/19      OT SHORT TERM GOAL #2   Title  Pt to be ind in HEP to increase AROM in R wrist to be able to wean out of splint and use hand in ADL's    Baseline  splint when up and about- wrist decrease flexion 22, ext 32 ,sup 75    Time  4    Period  Weeks    Status  New    Target Date  01/03/20        OT Long Term Goals - 12/06/19 1817      OT LONG TERM GOAL #1   Title  Pt AROM in R wrist increase in all planes more tan 75% compare to R wrist to use hand in more than 75% of function on PRWHE    Baseline  Pt only using hand in 16 % of function onPRWHE -and AROM decrease greatly - see flowsheet    Time  8    Period  Weeks    Status  New    Target Date  01/31/20      OT LONG TERM GOAL #2   Title  R grip and prehension  strenght increase to more than 60% compare to L to cut food, hold plate and carry more than 5 lbs    Baseline  NT 5 1/2 wks/s/p - only using hand 16 % in function on PRHWE    Time  8    Period  Weeks    Status  New    Target Date  01/31/20      OT LONG TERM GOAL #3   Title  Function on PRWHE improve with more than 30 points    Baseline  function score on PRWHE 42/50    Time  8    Period  Weeks    Status  New    Target Date  01/31/20             Plan - 01/17/20 1247    Clinical Impression Statement  Pt is 10 1/2 wks s/p R distal radius fx with ORIF - pt making progress in wrist extention more than flexion , and composite flexion of 4th and 5th digits - pt to focus on those this week. Scar tissue adhesions improving and strenght- did get PT order for her R shoulder - evaluation today    OT Occupational Profile and History  Problem Focused Assessment - Including review of records relating to presenting problem    Occupational performance deficits (Please refer to evaluation for details):  ADL's;IADL's;Play;Leisure;Work;Social Participation    Body Structure / Function / Physical Skills  ADL;Flexibility;ROM;UE functional use;Decreased knowledge of precautions;FMC;Scar mobility;Edema;Pain;IADL;Strength    Rehab Potential  Good    Clinical Decision Making  Limited treatment options, no task modification necessary    Comorbidities Affecting Occupational Performance:  None    Modification or Assistance to Complete Evaluation   No modification of tasks or assist necessary to complete eval    OT Frequency  2x / week    OT Duration  4 weeks    OT Treatment/Interventions  Self-care/ADL training;Therapeutic exercise;Patient/family education;Splinting;Paraffin;Fluidtherapy;Contrast Bath;Manual Therapy;Passive range of motion;Scar mobilization    Plan  assess progress with HEP    OT Home Exercise Plan  see pt instruction    Consulted and Agree with Plan of Care  Patient       Patient will benefit from skilled therapeutic intervention in order to improve the following deficits and impairments:   Body Structure / Function / Physical Skills: ADL, Flexibility, ROM, UE functional use, Decreased knowledge of precautions, FMC, Scar mobility, Edema, Pain, IADL, Strength       Visit Diagnosis: Stiffness of right wrist, not elsewhere classified  Muscle weakness (generalized)  Other disturbances of skin sensation  Pain in right  hand  Stiffness of right hand, not elsewhere classified  Pain in right wrist    Problem List There are no problems to display for this patient.   Oletta Cohn  OTR/l,CLT 01/17/2020, 12:50 PM  [facility] [address] [place], [address] Phone: [phone]   Fax:  [phone]  Name: Veronica Anthony MRN: [id number] Date of Birth: 01-07-1961

## 2020-01-17 NOTE — Therapy (Signed)
[facility] [address] [place], [address] Phone: [phone]   Fax:  [phone]  Physical Therapy Evaluation  Patient Details  Name: Veronica Anthony MRN: [id number] Date of Birth: 1961-05-07 Referring Provider (PT): Evon Slack, [place]   Encounter Date: 01/17/2020  PT End of Session - 01/17/20 1300    Visit Number  1    Number of Visits  13    Date for PT Re-Evaluation  03/02/20    PT Start Time  1300    PT Stop Time  1401    PT Time Calculation (min)  61 min    Activity Tolerance  Patient tolerated treatment well    Behavior During Therapy  [facility] for tasks assessed/performed       Past Medical History:  Diagnosis Date  . Complication of anesthesia    vomiting   . GERD (gastroesophageal reflux disease)   . History of kidney stones    2015  . Sleep apnea    2011     Past Surgical History:  Procedure Laterality Date  . OPEN REDUCTION INTERNAL FIXATION (ORIF) DISTAL RADIAL FRACTURE Right 11/04/2019   Procedure: OPEN REDUCTION INTERNAL FIXATION (ORIF) DISTAL RADIAL FRACTURE;  Surgeon: Kennedy Bucker, MD;  Location: [facility] ORS;  Service: Orthopedics;  Laterality: Right;  . TUBAL LIGATION  1991    There were no vitals filed for this visit.   Subjective Assessment - 01/17/20 1302    Subjective  R UE pain 5/10 (anterior lateral arm and forearm), 5/10 at worst for the past 2 months.    Pertinent History  R shoulder pain. Pain in R shoulder began after her R hand surgery on 11/04/2019. Pt would feel pain in his R anterior lateral forearm, biceps area, and poisterior shoulder. Pt uses ice in her R upper arm helps relax the muscles. Used to feelt a muscle tension R anterior latarl arm which is becoming smaller and smaller. Feels pain when reaching towards the side and up. Worst pain is when reaching up.  Bones hurt at night. R wrist fracture is almost completely healed. R UE pain has gotten a little better.  However it continues to bother her at night when she sleeps on her R or L sides.    Patient Stated Goals  No pain, when sleepin either on her R or L sides.    Currently in Pain?  Yes    Pain Score  5     Pain Orientation  Right    Pain Descriptors / Indicators  Sharp    Pain Type  Acute pain    Pain Onset  More than a month ago    Pain Frequency  Occasional    Aggravating Factors   shoulder flexion, abduction    Pain Relieving Factors  ice         [facility] PT Assessment - 01/17/20 1314      Assessment   Medical Diagnosis  R shoulder pain and stiffness    Referring Provider (PT)  Evon Slack, PA-C    Onset Date/Surgical Date  01/13/20   Date PT referral signed.    Next MD Visit  02/04/2020    Prior Therapy  No known PT for R shoulder      Precautions   Precaution Comments  R wrist fx       Posture/Postural Control   Posture Comments  R cervical side bend, R shoulder slightly lower, R scapula slightly more lateral  compared to L, slight R lateral shift      AROM   Overall AROM Comments  R shoulder flexion AAROM 135 degrees with medial elbow/distal arm pain.     Right Shoulder Flexion  133 Degrees   with anterior shoulder pain (acromion process); 135 AAROM   Right Shoulder ABduction  77 Degrees   with posterior and anterior arm pain   Cervical Flexion  Full    Cervical Extension  WFL with neck pain    Cervical - Right Side Bend  WFL with L upper trap tension    Cervical - Left Side [facility] with R upper trap stretch    Cervical - Right Rotation  WFL    Cervical - Left Rotation  WFL      Strength   Overall Strength Comments  hand placement for ER and IR MMT away from fracture    Right Shoulder Flexion  4+/5   with anterior lateral arm and forearm pain   Right Shoulder ABduction  4/5   with pain along the R radial nerve distribution   Right Shoulder Internal Rotation  4+/5   with lateral arm pain   Right Shoulder External Rotation  4/5      Palpation   Palpation  comment  muscle tension bilateral cervical paraspinal muscles as well as L > R upper trap muscles. TTP R infraspinatus with reproduction of posterior shoulder pain      Special Tests   Other special tests  (+) [place], empty can. (+) radial nerve tension R.                 Objective measurements completed on examination: See above findings.   Pt states that its ok, she needs an interpreter.    No pain with cooking, cleaning. However, when she stops doing her daily activities, she has pain.    Feb 04, 2020 is her next MD appointment and pt is supposed to be healed.   Reproduction of R shoulder pain with palpation to R infraspinatus  Reproduction of R UE pain with shoulder ER (infraspinatus muscle contraction palpated)   Pain located along R radial nerve distribution as well as at acromion process area  no pain with IR MMT  R forearm pain decreasd with R radial nerve neural tension testing     R wrist fracture  Interpreter: Maryjane Hurter    [employer]     Manual therapy  Seated STM R infraspinatus muscle  No R radial nerve pain with shoulder flexion and abduction afterwards.   Therapeutic exercise  Scapular retraction 8x5 seconds. L upper trap pain which eases with rest.   Improved exercise technique, movement at target joints, use of target muscles after mod verbal, visual, tactile cues.   Response to treatment Decreased R radial nerve symptoms after treatment to decrease infraspinatus muscle tension   Clinical impression Pt is a 59 year old female who came to physical therapy secondary to R shoulder pain. She also presents with limited R shoulder flexion and abduction AROM, positive special tests suggesting impingement; symptoms along the R radial nerve distribution, TTP R infraspinatus muscle with reproduction of symptoms, R shoulder weakness, and difficulty performing tasks which involve reaching. Pt will benefit from skilled physical therapy services to  address the aforementioned deficits.      PT Education - 01/17/20 1714    Education Details  plan of care    Person(s) Educated  Patient    Methods  Explanation  Comprehension  Verbalized understanding       PT Short Term Goals - 01/17/20 1717      PT SHORT TERM GOAL #1   Title  Pt will be independent with her HEP to decrease pain, improve AROM and ability to reach without pain.    Time  3    Period  Weeks    Status  New    Target Date  02/10/20        PT Long Term Goals - 01/17/20 1718      PT LONG TERM GOAL #1   Title  Patient will have a decrease in R shoulder and UE pain to 2/10 or less at worst to promote ability to raise her arm up as well as reach more comfortably.    Baseline  5/10 R shoulder pain at most for the past 2 months (01/17/2020)    Time  6    Period  Weeks    Status  New    Target Date  03/02/20      PT LONG TERM GOAL #2   Title  Patient will improve R shoulder strength by at least 1/2 MMT grade to promote ability to perform tasks with her R UE with less difficulty.    Time  6    Period  Weeks    Status  New    Target Date  03/02/20      PT LONG TERM GOAL #3   Title  Pt will improve her shoulder FOTO score by at least 10 points as a demonstration of improved function.    Baseline  shoulder FOTO: 38 (01/17/2020)    Time  6    Period  Weeks    Status  New    Target Date  03/02/20      PT LONG TERM GOAL #4   Title  Patient will improve R shoulder flexion and abduction AROM by at least 15 degrees to promote ability to reach with less difficulty.    Baseline  AROM R shoulder: flexion 133 degrees, abduction 77 degrees (01/17/2020)    Time  6    Period  Weeks    Status  New    Target Date  03/02/20             Plan - 01/17/20 1715    Clinical Impression Statement  Pt is a 59 year old female who came to physical therapy secondary to R shoulder pain. She also presents with limited R shoulder flexion and abduction AROM, positive special tests  suggesting impingement; symptoms along the R radial nerve distribution, TTP R infraspinatus muscle with reproduction of symptoms, R shoulder weakness, and difficulty performing tasks which involve reaching. Pt will benefit from skilled physical therapy services to address the aforementioned deficits.    Personal Factors and Comorbidities  Comorbidity 1;Time since onset of injury/illness/exacerbation;Profession    Comorbidities  R wrist fx    Examination-Activity Limitations  Bathing;Reach Overhead;Dressing;Hygiene/Grooming;Toileting;Lift;Carry    Stability/Clinical Decision Making  Stable/Uncomplicated    Clinical Decision Making  Low    Rehab Potential  Fair    PT Frequency  2x / week    PT Duration  6 weeks    PT Treatment/Interventions  Therapeutic activities;Therapeutic exercise;Neuromuscular re-education;Patient/family education;Manual techniques;Dry needling;Aquatic Therapy;Electrical Stimulation;Iontophoresis 4mg /ml Dexamethasone;Ultrasound    PT Next Visit Plan  scapular strengthening, thoracic extension, glenohumeral control, neural flossing, manual techniques, modalities PRN    Consulted and Agree with Plan of Care  Patient  Patient will benefit from skilled therapeutic intervention in order to improve the following deficits and impairments:     Visit Diagnosis: Acute pain of right shoulder - Plan: PT plan of care cert/re-cert  Muscle weakness (generalized) - Plan: PT plan of care cert/re-cert  Radiculopathy, cervicothoracic region - Plan: PT plan of care cert/re-cert     Problem List There are no problems to display for this patient.   Joneen Boers PT, DPT   01/17/2020, 6:46 PM  [facility] [facility] [address], [place], [address] Phone: [phone]   Fax:  [phone]  Name: Veronica Anthony MRN: [id number] Date of Birth: [birth year]

## 2020-01-20 ENCOUNTER — Ambulatory Visit: Payer: BC Managed Care – PPO

## 2020-01-20 ENCOUNTER — Ambulatory Visit: Payer: BC Managed Care – PPO | Admitting: Occupational Therapy

## 2020-01-20 ENCOUNTER — Other Ambulatory Visit: Payer: Self-pay

## 2020-01-20 DIAGNOSIS — M6281 Muscle weakness (generalized): Secondary | ICD-10-CM

## 2020-01-20 DIAGNOSIS — M25631 Stiffness of right wrist, not elsewhere classified: Secondary | ICD-10-CM

## 2020-01-20 DIAGNOSIS — M5413 Radiculopathy, cervicothoracic region: Secondary | ICD-10-CM

## 2020-01-20 DIAGNOSIS — M25641 Stiffness of right hand, not elsewhere classified: Secondary | ICD-10-CM

## 2020-01-20 DIAGNOSIS — M25511 Pain in right shoulder: Secondary | ICD-10-CM

## 2020-01-20 NOTE — Therapy (Signed)
[place] [facility] [address], [place], [address] Phone: [phone]   Fax:  [phone]  Physical Therapy Treatment  Patient Details  Name: Veronica Anthony MRN: [id number] Date of Birth: 1961/05/25 Referring Provider (PT): Duanne Guess, [place]   Encounter Date: 01/20/2020  PT End of Session - 01/20/20 1347    Visit Number  2    Number of Visits  13    Date for PT Re-Evaluation  03/02/20    PT Start Time  1347    PT Stop Time  1430    PT Time Calculation (min)  43 min    Activity Tolerance  Patient tolerated treatment well    Behavior During Therapy  [facility] for tasks assessed/performed       Past Medical History:  Diagnosis Date  . Complication of anesthesia    vomiting   . GERD (gastroesophageal reflux disease)   . History of kidney stones    2015  . Sleep apnea    2011     Past Surgical History:  Procedure Laterality Date  . OPEN REDUCTION INTERNAL FIXATION (ORIF) DISTAL RADIAL FRACTURE Right 11/04/2019   Procedure: OPEN REDUCTION INTERNAL FIXATION (ORIF) DISTAL RADIAL FRACTURE;  Surgeon: Hessie Knows, MD;  Location: [facility] ORS;  Service: Orthopedics;  Laterality: Right;  . TUBAL LIGATION  1991    There were no vitals filed for this visit.  Subjective Assessment - 01/20/20 1350    Subjective  Patient reported that she is feeling better since her first visit. Stated she is sleeping better, and her pain is better.    Pertinent History  R shoulder pain. Pain in R shoulder began after her R hand surgery on 11/04/2019. Pt would feel pain in his R anterior lateral forearm, biceps area, and poisterior shoulder. Pt uses ice in her R upper arm helps relax the muscles. Used to feelt a muscle tension R anterior latarl arm which is becoming smaller and smaller. Feels pain when reaching towards the side and up. Worst pain is when reaching up.  Bones hurt at night. R wrist fracture is almost completely healed. R UE pain  has gotten a little better. However it continues to bother her at night when she sleeps on her R or L sides.    Patient Stated Goals  No pain, when sleepin either on her R or L sides.    Currently in Pain?  Yes    Pain Score  5     Pain Location  Shoulder    Pain Orientation  Right    Pain Descriptors / Indicators  Sharp    Pain Onset  More than a month ago    Pain Frequency  Occasional       Objective  R wrist fracture   Manual therapy  Seated STM R infraspinatus muscle, supraspinatus muscle   Seated posterior glide of R shoulder II Supine posterior glide of R shoulder grade II        Inferior glide of R shoulder in supine grade III     Therapeutic exercise  Scapular retraction 10x5 seconds. L upper trap pain which eases with rest.   ER/IR isometrics with towel against doorway  PROM flexion and abduction x20 ea direction  Radial nerve glide in supine after significant pain with abduction; x15 performed, glides limited by lack of wrist flexion, able to return to shoulder abduction with significant reduction in pina  Supine scapular retractions x10 with visual cueing  Improved exercise technique, movement at target joints, use of target muscles after mod verbal, visual, tactile cues.   Response to treatment/clinical impression: Pt reported reduction in pain with shoulder abduction PROM after PROM radial glides (limited by decreased R wrist flexion). The patient continued to report reduction of symptoms with STM of posterior shoulder as well. Patient provided with visual feedback to address exercise technique of scapular retractions, further education recommended. The patient would benefit from continued skilled PT interventions to address limitations to maximize functional activities and reduce pain.     PT Education - 01/20/20 1346    Education Details  therex    Person(s) Educated  Patient    Methods  Explanation    Comprehension  Verbalized  understanding;Returned demonstration       PT Short Term Goals - 01/17/20 1717      PT SHORT TERM GOAL #1   Title  Pt will be independent with her HEP to decrease pain, improve AROM and ability to reach without pain.    Time  3    Period  Weeks    Status  New    Target Date  02/10/20        PT Long Term Goals - 01/17/20 1718      PT LONG TERM GOAL #1   Title  Patient will have a decrease in R shoulder and UE pain to 2/10 or less at worst to promote ability to raise her arm up as well as reach more comfortably.    Baseline  5/10 R shoulder pain at most for the past 2 months (01/17/2020)    Time  6    Period  Weeks    Status  New    Target Date  03/02/20      PT LONG TERM GOAL #2   Title  Patient will improve R shoulder strength by at least 1/2 MMT grade to promote ability to perform tasks with her R UE with less difficulty.    Time  6    Period  Weeks    Status  New    Target Date  03/02/20      PT LONG TERM GOAL #3   Title  Pt will improve her shoulder FOTO score by at least 10 points as a demonstration of improved function.    Baseline  shoulder FOTO: 38 (01/17/2020)    Time  6    Period  Weeks    Status  New    Target Date  03/02/20      PT LONG TERM GOAL #4   Title  Patient will improve R shoulder flexion and abduction AROM by at least 15 degrees to promote ability to reach with less difficulty.    Baseline  AROM R shoulder: flexion 133 degrees, abduction 77 degrees (01/17/2020)    Time  6    Period  Weeks    Status  New    Target Date  03/02/20            Plan - 01/20/20 1347    Clinical Impression Statement  Pt reported reduction in pain with shoulder abduction PROM after PROM radial glides (limited by decreased R wrist flexion). The patient continued to report reduction of symptoms with STM of posterior shoulder as well. Patient provided with visual feedback to address exercise technique of scapular retractions, further education recommended. The patient would  benefit from continued skilled PT interventions to address limitations to maximize functional activities and reduce pain.  Personal Factors and Comorbidities  Comorbidity 1;Time since onset of injury/illness/exacerbation;Profession    Comorbidities  R wrist fx    Examination-Activity Limitations  Bathing;Reach Overhead;Dressing;Hygiene/Grooming;Toileting;Lift;Carry    Stability/Clinical Decision Making  Stable/Uncomplicated    Rehab Potential  Fair    PT Frequency  2x / week    PT Duration  6 weeks    PT Treatment/Interventions  Therapeutic activities;Therapeutic exercise;Neuromuscular re-education;Patient/family education;Manual techniques;Dry needling;Aquatic Therapy;Electrical Stimulation;Iontophoresis 4mg /ml Dexamethasone;Ultrasound    PT Next Visit Plan  scapular strengthening, thoracic extension, glenohumeral control, neural flossing, manual techniques, modalities PRN    Consulted and Agree with Plan of Care  Patient       Patient will benefit from skilled therapeutic intervention in order to improve the following deficits and impairments:  Pain, Improper body mechanics, Decreased mobility, Increased muscle spasms, Postural dysfunction, Decreased activity tolerance, Decreased endurance, Decreased range of motion, Decreased strength, Hypomobility, Difficulty walking  Visit Diagnosis: Acute pain of right shoulder  Muscle weakness (generalized)  Radiculopathy, cervicothoracic region     Problem List There are no problems to display for this patient.   PT, DPT 2:31 PM,01/20/20   [facility] [address], [place] Phone: [phone]   Fax:  [phone]  Name: Fraidy Mccarrick MRN: Rosalyn Charters Date of Birth: 07/10/1961

## 2020-01-20 NOTE — Therapy (Signed)
[place] [facility] [address], [place], [address] Phone: [phone]   Fax:  [phone]  Occupational Therapy Treatment  Patient Details  Name: Veronica Anthony MRN: [id number] Date of Birth: 1961-01-06 No data recorded  Encounter Date: 01/20/2020  OT End of Session - 01/20/20 1623    Visit Number  13    Number of Visits  16    Date for OT Re-Evaluation  01/31/20    OT Start Time  1148    OT Stop Time  1233    OT Time Calculation (min)  45 min    Activity Tolerance  Patient tolerated treatment well    Behavior During Therapy  [facility] for tasks assessed/performed       Past Medical History:  Diagnosis Date  . Complication of anesthesia    vomiting   . GERD (gastroesophageal reflux disease)   . History of kidney stones    2015  . Sleep apnea    2011     Past Surgical History:  Procedure Laterality Date  . OPEN REDUCTION INTERNAL FIXATION (ORIF) DISTAL RADIAL FRACTURE Right 11/04/2019   Procedure: OPEN REDUCTION INTERNAL FIXATION (ORIF) DISTAL RADIAL FRACTURE;  Surgeon: Hessie Knows, MD;  Location: [facility] ORS;  Service: Orthopedics;  Laterality: Right;  . TUBAL LIGATION  1991    There were no vitals filed for this visit.  Subjective Assessment - 01/20/20 1616    Subjective   I am doing okay- my shoulder feeling better since seen [facility] - could sleep better and reach better- I using my hand in everything    Pertinent History  Pt fell on 2/10 - close reduction in ER was done - but then needed -  ORIF of the right distal radius. Date of surgery was 11/03/2018 - refer to hand therap because of stiffness and scar massage    Patient Stated Goals  I want to be able to use my R dominant hand and wrist so I can go back to work , work in yard, drive , cook and ride bike    Currently in Pain?  No/denies                   OT Treatments/Exercises (OP) - 01/20/20 0001      RUE Paraffin   RUE Paraffin  Location  Hand    Comments  hand in fist and stretch wrist flexion 3 x 2 min         Measurements taken - digits 5th MC 70 and 4th 80 degrees same as last time   Pt fitted with knuckle bender- with 4 on ulnar side , and 3 on radial side -pt to use 3 x day for 5 min  - slight pull - and follow with AROM composite fist     Reinforce again for pt to focus wrist flexion stretch2 min x 3, extention 1 min x 2- pt guarding at times  - but able to demo correctly this date and OT done this date in clinic - had  50 in session for wrist flexion  No pain or tenderness over 3rd A1pulley-and decrease stiffness or pain  Pt to focus on wrist flexion , extended stretches - And composite flexion of digits 4th and 5th  And can do again 2 lbs for wrist flexion 10 reps, place and hold extention        OT Education - 01/20/20 1623    Education Details  HEP change and  what to focus on    Person(s) Educated  Patient    Methods  Explanation;Demonstration;Tactile cues;Verbal cues;Handout    Comprehension  Returned demonstration;Verbalized understanding;Verbal cues required       OT Short Term Goals - 12/06/19 1814      OT SHORT TERM GOAL #1   Title  Pt to be Ind in HEP to increase digits AROM in flexion and extention to WNL    Baseline  extention of digits -15 at [facility] and PIP of 5th , flexion MC's 45-65 degrees , PIP's 90 to 100    Time  3    Period  Weeks    Status  New    Target Date  12/27/19      OT SHORT TERM GOAL #2   Title  Pt to be ind in HEP to increase AROM in R wrist to be able to wean out of splint and use hand in ADL's    Baseline  splint when up and about- wrist decrease flexion 22, ext 32 ,sup 75    Time  4    Period  Weeks    Status  New    Target Date  01/03/20        OT Long Term Goals - 12/06/19 1817      OT LONG TERM GOAL #1   Title  Pt AROM in R wrist increase in all planes more tan 75% compare to R wrist to use hand in more than 75% of function on PRWHE     Baseline  Pt only using hand in 16 % of function onPRWHE -and AROM decrease greatly - see flowsheet    Time  8    Period  Weeks    Status  New    Target Date  01/31/20      OT LONG TERM GOAL #2   Title  R grip and prehension strenght increase to more than 60% compare to L to cut food, hold plate and carry more than 5 lbs    Baseline  NT 5 1/2 wks/s/p - only using hand 16 % in function on PRHWE    Time  8    Period  Weeks    Status  New    Target Date  01/31/20      OT LONG TERM GOAL #3   Title  Function on PRWHE improve with more than 30 points    Baseline  function score on PRWHE 42/50    Time  8    Period  Weeks    Status  New    Target Date  01/31/20            Plan - 01/20/20 1623    Clinical Impression Statement  Pt is 11 wks s/p R distal radius fx with ORIF - pt cont to have problem doing composite flexion to 5th and 4th - fitted with knuckle bender to use at home and then composite AORM - cont to focus on wrist flexion , extention stretches    OT Occupational Profile and History  Problem Focused Assessment - Including review of records relating to presenting problem    Occupational performance deficits (Please refer to evaluation for details):  ADL's;IADL's;Play;Leisure;Work;Social Participation    Body Structure / Function / Physical Skills  ADL;Flexibility;ROM;UE functional use;Decreased knowledge of precautions;FMC;Scar mobility;Edema;Pain;IADL;Strength    Rehab Potential  Good    Clinical Decision Making  Limited treatment options, no task modification necessary    Comorbidities Affecting Occupational Performance:  None  Modification or Assistance to Complete Evaluation   No modification of tasks or assist necessary to complete eval    OT Frequency  2x / week    OT Duration  4 weeks    OT Treatment/Interventions  Self-care/ADL training;Therapeutic exercise;Patient/family education;Splinting;Paraffin;Fluidtherapy;Contrast Bath;Manual Therapy;Passive range of  motion;Scar mobilization    Plan  assess progress with HEP    OT Home Exercise Plan  see pt instruction    Consulted and Agree with Plan of Care  Patient       Patient will benefit from skilled therapeutic intervention in order to improve the following deficits and impairments:   Body Structure / Function / Physical Skills: ADL, Flexibility, ROM, UE functional use, Decreased knowledge of precautions, FMC, Scar mobility, Edema, Pain, IADL, Strength       Visit Diagnosis: Muscle weakness (generalized)  Stiffness of right wrist, not elsewhere classified  Stiffness of right hand, not elsewhere classified    Problem List There are no problems to display for this patient.   Oletta Cohn OTR/L,CLT 01/20/2020, 4:25 PM  [place] [facility] [address] [place], [address] Phone: [phone]   Fax:  [phone]  Name: Veronica Anthony MRN: [id number] Date of Birth: 09-Feb-1961

## 2020-01-20 NOTE — Patient Instructions (Signed)
See note

## 2020-01-25 ENCOUNTER — Ambulatory Visit: Payer: BC Managed Care – PPO

## 2020-01-25 ENCOUNTER — Ambulatory Visit: Payer: BC Managed Care – PPO | Admitting: Occupational Therapy

## 2020-01-25 ENCOUNTER — Other Ambulatory Visit: Payer: Self-pay

## 2020-01-25 DIAGNOSIS — M6281 Muscle weakness (generalized): Secondary | ICD-10-CM

## 2020-01-25 DIAGNOSIS — M79641 Pain in right hand: Secondary | ICD-10-CM

## 2020-01-25 DIAGNOSIS — M5413 Radiculopathy, cervicothoracic region: Secondary | ICD-10-CM

## 2020-01-25 DIAGNOSIS — M25641 Stiffness of right hand, not elsewhere classified: Secondary | ICD-10-CM

## 2020-01-25 DIAGNOSIS — R208 Other disturbances of skin sensation: Secondary | ICD-10-CM

## 2020-01-25 DIAGNOSIS — M25631 Stiffness of right wrist, not elsewhere classified: Secondary | ICD-10-CM

## 2020-01-25 DIAGNOSIS — M25511 Pain in right shoulder: Secondary | ICD-10-CM

## 2020-01-25 NOTE — Patient Instructions (Signed)
See note

## 2020-01-25 NOTE — Therapy (Signed)
[facility] [facility] [address] [place], [address] Phone: [phone]   Fax:  [phone]  Physical Therapy Treatment  Patient Details  Name: Veronica Anthony MRN: [id number] Date of Birth: 01-02-1961 Referring Provider (PT): Evon Slack, [place]   Encounter Date: 01/25/2020  PT End of Session - 01/25/20 0818    Visit Number  3    Number of Visits  13    Date for PT Re-Evaluation  03/02/20    PT Start Time  0815    PT Stop Time  0845    PT Time Calculation (min)  30 min    Activity Tolerance  Patient tolerated treatment well    Behavior During Therapy  [facility] for tasks assessed/performed       Past Medical History:  Diagnosis Date  . Complication of anesthesia    vomiting   . GERD (gastroesophageal reflux disease)   . History of kidney stones    2015  . Sleep apnea    2011     Past Surgical History:  Procedure Laterality Date  . OPEN REDUCTION INTERNAL FIXATION (ORIF) DISTAL RADIAL FRACTURE Right 11/04/2019   Procedure: OPEN REDUCTION INTERNAL FIXATION (ORIF) DISTAL RADIAL FRACTURE;  Surgeon: Kennedy Bucker, MD;  Location: [facility] ORS;  Service: Orthopedics;  Laterality: Right;  . TUBAL LIGATION  1991    There were no vitals filed for this visit.  Subjective Assessment - 01/25/20 0816    Subjective  Patient reported her pain is a 3/10 in her shoulder.    Pertinent History  R shoulder pain. Pain in R shoulder began after her R hand surgery on 11/04/2019. Pt would feel pain in his R anterior lateral forearm, biceps area, and poisterior shoulder. Pt uses ice in her R upper arm helps relax the muscles. Used to feelt a muscle tension R anterior latarl arm which is becoming smaller and smaller. Feels pain when reaching towards the side and up. Worst pain is when reaching up.  Bones hurt at night. R wrist fracture is almost completely healed. R UE pain has gotten a little better. However it continues to bother her at  night when she sleeps on her R or L sides.    Patient Stated Goals  No pain, when sleepin either on her R or L sides.    Currently in Pain?  Yes    Pain Score  3     Pain Location  Shoulder    Pain Orientation  Right    Pain Descriptors / Indicators  Sharp    Pain Type  Acute pain    Pain Onset  More than a month ago    Pain Frequency  Occasional       Objective  R wrist fracture     Manual therapy   Seated STM R infraspinatus muscle, supraspinatus muscle   Supine posterior glide of R shoulder grade III           Therapeutic exercise    PROM flexion and abduction x30 ea direction   Radial nerve glide in supine after significant pain with flexion and abduction; x15 performed, glides limited by lack of wrist flexion, able to return to shoulder flexion and then abduction with significant reduction in pina   Supine scapular retractions x10 with visual cueing   Improved exercise technique, movement at target joints, use of target muscles after mod verbal, visual, tactile cues.    Response to treatment/clinical impression: Pt demonstrated improved ROM  with R shoulder after radial glides and repetition of PROM. Session shortened due to clinic scheduling conflicts, pt aware. The patient continued to report improvement in R shoulder symptoms, remains highly motivated to continue therapy. The patient would benefit from further intervention to progress towards goals.     PT Education - 01/25/20 0817    Education Details  therex    Person(s) Educated  Patient    Methods  Explanation;Demonstration;Tactile cues;Verbal cues    Comprehension  Verbalized understanding;Returned demonstration       PT Short Term Goals - 01/17/20 1717      PT SHORT TERM GOAL #1   Title  Pt will be independent with her HEP to decrease pain, improve AROM and ability to reach without pain.    Time  3    Period  Weeks    Status  New    Target Date  02/10/20        PT Long Term Goals - 01/17/20  1718      PT LONG TERM GOAL #1   Title  Patient will have a decrease in R shoulder and UE pain to 2/10 or less at worst to promote ability to raise her arm up as well as reach more comfortably.    Baseline  5/10 R shoulder pain at most for the past 2 months (01/17/2020)    Time  6    Period  Weeks    Status  New    Target Date  03/02/20      PT LONG TERM GOAL #2   Title  Patient will improve R shoulder strength by at least 1/2 MMT grade to promote ability to perform tasks with her R UE with less difficulty.    Time  6    Period  Weeks    Status  New    Target Date  03/02/20      PT LONG TERM GOAL #3   Title  Pt will improve her shoulder FOTO score by at least 10 points as a demonstration of improved function.    Baseline  shoulder FOTO: 38 (01/17/2020)    Time  6    Period  Weeks    Status  New    Target Date  03/02/20      PT LONG TERM GOAL #4   Title  Patient will improve R shoulder flexion and abduction AROM by at least 15 degrees to promote ability to reach with less difficulty.    Baseline  AROM R shoulder: flexion 133 degrees, abduction 77 degrees (01/17/2020)    Time  6    Period  Weeks    Status  New    Target Date  03/02/20            Plan - 01/25/20 0939    Clinical Impression Statement  Pt demonstrated improved ROM with R shoulder after radial glides and repetition of PROM. Session shortened due to clinic scheduling conflicts, pt aware. The patient continued to report improvement in R shoulder symptoms, remains highly motivated to continue therapy. The patient would benefit from further intervention to progress towards goals.    Personal Factors and Comorbidities  Comorbidity 1;Time since onset of injury/illness/exacerbation;Profession    Comorbidities  R wrist fx    Examination-Activity Limitations  Bathing;Reach Overhead;Dressing;Hygiene/Grooming;Toileting;Lift;Carry    Stability/Clinical Decision Making  Stable/Uncomplicated    Rehab Potential  Fair    PT  Frequency  2x / week    PT Duration  6 weeks  PT Treatment/Interventions  Therapeutic activities;Therapeutic exercise;Neuromuscular re-education;Patient/family education;Manual techniques;Dry needling;Aquatic Therapy;Electrical Stimulation;Iontophoresis 4mg /ml Dexamethasone;Ultrasound    PT Next Visit Plan  scapular strengthening, thoracic extension, glenohumeral control, neural flossing, manual techniques, modalities PRN    Consulted and Agree with Plan of Care  Patient       Patient will benefit from skilled therapeutic intervention in order to improve the following deficits and impairments:  Pain, Improper body mechanics, Decreased mobility, Increased muscle spasms, Postural dysfunction, Decreased activity tolerance, Decreased endurance, Decreased range of motion, Decreased strength, Hypomobility, Difficulty walking  Visit Diagnosis: Acute pain of right shoulder  Muscle weakness (generalized)  Radiculopathy, cervicothoracic region  Other disturbances of skin sensation     Problem List There are no problems to display for this patient.   PT, DPT 9:41 AM,01/25/20   [place]TS [facility] [address], [place] Phone: [phone]   Fax:  [phone]  Name: Mariateresa Batra MRN: Rosalyn Charters Date of Birth: [birth year]

## 2020-01-25 NOTE — Therapy (Signed)
[place] [facility] [address] [place], [address] Phone: [phone]   Fax:  [phone]  Occupational Therapy Treatment  Patient Details  Name: Veronica Anthony MRN: [id number] Date of Birth: Feb 21, 1961 No data recorded  Encounter Date: 01/25/2020  OT End of Session - 01/25/20 1152    Visit Number  14    Number of Visits  16    Date for OT Re-Evaluation  01/31/20    OT Start Time  0855    OT Stop Time  0940    OT Time Calculation (min)  45 min    Activity Tolerance  Patient tolerated treatment well    Behavior During Therapy  [facility] for tasks assessed/performed       Past Medical History:  Diagnosis Date  . Complication of anesthesia    vomiting   . GERD (gastroesophageal reflux disease)   . History of kidney stones    2015  . Sleep apnea    2011     Past Surgical History:  Procedure Laterality Date  . OPEN REDUCTION INTERNAL FIXATION (ORIF) DISTAL RADIAL FRACTURE Right 11/04/2019   Procedure: OPEN REDUCTION INTERNAL FIXATION (ORIF) DISTAL RADIAL FRACTURE;  Surgeon: Hessie Knows, MD;  Location: [facility] ORS;  Service: Orthopedics;  Laterality: Right;  . TUBAL LIGATION  1991    There were no vitals filed for this visit.  Subjective Assessment - 01/25/20 1149    Subjective   I done the exercises - the splint with rubber band -and did do some of the putty yesterday - middle finger got little sore- but I can tell the wrist is getting looser and the scar    Pertinent History  Pt fell on 2/10 - close reduction in ER was done - but then needed -  ORIF of the right distal radius. Date of surgery was 11/03/2018 - refer to hand therap because of stiffness and scar massage    Patient Stated Goals  I want to be able to use my R dominant hand and wrist so I can go back to work , work in yard, drive , cook and ride bike    Currently in Pain?  No/denies         [facility] OT Assessment - 01/25/20 0001      AROM   Right  Wrist Flexion  50 Degrees      Strength   Right Hand Grip (lbs)  21    Right Hand Lateral Pinch  40 lbs       Wrist strength in all planes in the range she has - is 4+/5     Arrive again with decrease MC and composite flexion in 4th and 5th digits R  hand     OT Treatments/Exercises (OP) - 01/25/20 0001      RUE Paraffin   Number Minutes Paraffin  8 Minutes    RUE Paraffin Location  Hand    Comments  coban flexion wrap to 4th and 5th digit to increase composite flexion      done wrist flexion stretch during paraffin    Graston tool nr 2 done for brushing in volar 4th and 5th digit-and palm and sweeping on volar forearm and wrist prior to ROM Review again with pt wearing and use of knuckle bender- with 4 on ulnar side , and 3 on radial side -pt to use 3 x day for 5 min  - slight pull - and follow with AROM composite fist Pt  was doing active MC extention in splint - NOT TO DO      Reinforce again for pt to focus wrist flexion stretch2 min x 3, extention 1 min x 2- pt guardingat times- but able to demo correctly this date and OT done this date in clinic flexion and extention stretches to wrist   No pain or tenderness over 3rd A1pulley-and decrease stiffness or pain AROM for composite fist to palm - reinforce need to be composite and focus on composite flexion of digits4th and 5th- prior to putty  Can do again light blue putty - but grip more with 4th and 5th digits  Cont with  2 lbs for wristflexion 10 reps, place and hold extention       OT Education - 01/25/20 1151    Education Details  HEP change and what to focus on    Person(s) Educated  Patient    Methods  Explanation;Demonstration;Tactile cues;Verbal cues;Handout    Comprehension  Returned demonstration;Verbalized understanding;Verbal cues required       OT Short Term Goals - 12/06/19 1814      OT SHORT TERM GOAL #1   Title  Pt to be Ind in HEP to increase digits AROM in flexion and  extention to WNL    Baseline  extention of digits -15 at [facility] and PIP of 5th , flexion MC's 45-65 degrees , PIP's 90 to 100    Time  3    Period  Weeks    Status  New    Target Date  12/27/19      OT SHORT TERM GOAL #2   Title  Pt to be ind in HEP to increase AROM in R wrist to be able to wean out of splint and use hand in ADL's    Baseline  splint when up and about- wrist decrease flexion 22, ext 32 ,sup 75    Time  4    Period  Weeks    Status  New    Target Date  01/03/20        OT Long Term Goals - 12/06/19 1817      OT LONG TERM GOAL #1   Title  Pt AROM in R wrist increase in all planes more tan 75% compare to R wrist to use hand in more than 75% of function on PRWHE    Baseline  Pt only using hand in 16 % of function onPRWHE -and AROM decrease greatly - see flowsheet    Time  8    Period  Weeks    Status  New    Target Date  01/31/20      OT LONG TERM GOAL #2   Title  R grip and prehension strenght increase to more than 60% compare to L to cut food, hold plate and carry more than 5 lbs    Baseline  NT 5 1/2 wks/s/p - only using hand 16 % in function on PRHWE    Time  8    Period  Weeks    Status  New    Target Date  01/31/20      OT LONG TERM GOAL #3   Title  Function on PRWHE improve with more than 30 points    Baseline  function score on PRWHE 42/50    Time  8    Period  Weeks    Status  New    Target Date  01/31/20  Plan - 01/25/20 1152    Clinical Impression Statement  Pt is close to 12 wks s/p R distal radius fx with ORIF - pt show progress in scar tissue adhesion , wrist flexion and extention and grip strength -but had to review again use of knuckle bender for pt to focus on composite flexion and MC flexion of 4th and 5th - strength in wrist AROM 4+/5    OT Occupational Profile and History  Problem Focused Assessment - Including review of records relating to presenting problem    Occupational performance deficits (Please refer to evaluation  for details):  ADL's;IADL's;Play;Leisure;Work;Social Participation    Body Structure / Function / Physical Skills  ADL;Flexibility;ROM;UE functional use;Decreased knowledge of precautions;FMC;Scar mobility;Edema;Pain;IADL;Strength    Rehab Potential  Good    Clinical Decision Making  Limited treatment options, no task modification necessary    Comorbidities Affecting Occupational Performance:  None    Modification or Assistance to Complete Evaluation   No modification of tasks or assist necessary to complete eval    OT Frequency  2x / week    OT Duration  4 weeks    OT Treatment/Interventions  Self-care/ADL training;Therapeutic exercise;Patient/family education;Splinting;Paraffin;Fluidtherapy;Contrast Bath;Manual Therapy;Passive range of motion;Scar mobilization    Plan  assess progress with HEP    OT Home Exercise Plan  see pt instruction    Consulted and Agree with Plan of Care  Patient       Patient will benefit from skilled therapeutic intervention in order to improve the following deficits and impairments:   Body Structure / Function / Physical Skills: ADL, Flexibility, ROM, UE functional use, Decreased knowledge of precautions, FMC, Scar mobility, Edema, Pain, IADL, Strength       Visit Diagnosis: Muscle weakness (generalized)  Stiffness of right wrist, not elsewhere classified  Stiffness of right hand, not elsewhere classified  Pain in right hand    Problem List There are no problems to display for this patient.   Oletta Cohn OTR/L,CLT 01/25/2020, 11:54 AM  [place] [facility] [address], [place], [address] Phone: [phone]   Fax:  [phone]  Name: Veronica Anthony MRN: [id number] Date of Birth: [birth year]

## 2020-01-27 ENCOUNTER — Ambulatory Visit: Payer: BC Managed Care – PPO | Admitting: Occupational Therapy

## 2020-01-27 ENCOUNTER — Other Ambulatory Visit: Payer: Self-pay

## 2020-01-27 DIAGNOSIS — M25631 Stiffness of right wrist, not elsewhere classified: Secondary | ICD-10-CM

## 2020-01-27 DIAGNOSIS — M6281 Muscle weakness (generalized): Secondary | ICD-10-CM

## 2020-01-27 DIAGNOSIS — M79641 Pain in right hand: Secondary | ICD-10-CM

## 2020-01-27 DIAGNOSIS — M25641 Stiffness of right hand, not elsewhere classified: Secondary | ICD-10-CM

## 2020-01-27 NOTE — Patient Instructions (Signed)
same

## 2020-01-27 NOTE — Therapy (Signed)
[place] [facility] [address], [place], [address] Phone: [phone]   Fax:  [phone]  Occupational Therapy Treatment  Patient Details  Name: Veronica Anthony MRN: [id number] Date of Birth: 27-Jun-1961 No data recorded  Encounter Date: 01/27/2020  OT End of Session - 01/27/20 1128    Visit Number  15    Number of Visits  16    Date for OT Re-Evaluation  01/31/20    OT Start Time  1129    OT Stop Time  1216    OT Time Calculation (min)  47 min    Activity Tolerance  Patient tolerated treatment well    Behavior During Therapy  [facility] for tasks assessed/performed       Past Medical History:  Diagnosis Date  . Complication of anesthesia    vomiting   . GERD (gastroesophageal reflux disease)   . History of kidney stones    2015  . Sleep apnea    2011     Past Surgical History:  Procedure Laterality Date  . OPEN REDUCTION INTERNAL FIXATION (ORIF) DISTAL RADIAL FRACTURE Right 11/04/2019   Procedure: OPEN REDUCTION INTERNAL FIXATION (ORIF) DISTAL RADIAL FRACTURE;  Surgeon: Hessie Knows, MD;  Location: [facility] ORS;  Service: Orthopedics;  Laterality: Right;  . TUBAL LIGATION  1991    There were no vitals filed for this visit.  Subjective Assessment - 01/27/20 1225    Subjective   My wrist and hand is better- it is just my shoulder- I use my hand in cooking and cleaning, I was surprise that I could do that 10 lbs that you gave me to carry and push the heavy door open    Pertinent History  Pt fell on 2/10 - close reduction in ER was done - but then needed -  ORIF of the right distal radius. Date of surgery was 11/03/2018 - refer to hand therap because of stiffness and scar massage    Patient Stated Goals  I want to be able to use my R dominant hand and wrist so I can go back to work , work in yard, drive , cook and ride bike    Currently in Pain?  Yes    Pain Score  3     Pain Location  Shoulder    Pain Orientation   Right    Pain Descriptors / Indicators  Sharp;Aching    Pain Type  Acute pain         [facility] OT Assessment - 01/27/20 0001      Strength   Right Hand Grip (lbs)  25    Right Hand Lateral Pinch  11 lbs    Right Hand 3 Point Pinch  11 lbs    Left Hand Grip (lbs)  40    Left Hand Lateral Pinch  16 lbs    Left Hand 3 Point Pinch  16 lbs       Wrist strength in all planes in the range she has - is 4+/5  Wrist flexion 48 and extention 60 -  Grip strength increase - see flow sheet          OT Treatments/Exercises (OP) - 01/27/20 0001      RUE Paraffin   Number Minutes Paraffin  8 Minutes    RUE Paraffin Location  Hand    Comments  coban flexion wrap to 4th and 5th composite        done wrist flexion stretch  And extention during paraffin by OT too and pt  Graston tool nr 2 done for brushing in volar 4th and 5th digit-and palm and sweeping on volar forearm and wrist prior to ROM Review again with pt wearing and use of knuckle bender- with 5 rubber bands on ulnar side , and 3 on radial side -pt to use 3 x day for 5 min - slight pull - and follow with AROM composite fist Great progress afterwards      Reinforce again for pt tofocus wrist flexion stretch2 min x 3, extention 1 min x 2- pt guardingat times- but able to demo correctly this date and OT done this date in clinic flexion and extention stretches to wrist   No pain ortenderness over 3rd A1pulley-and decrease stiffness or pain AROM for composite fist to palm - reinforce need to be composite and focus on composite flexion of digits4th and 5th- prior to putty  Upgrade to teal med putty for composite grip strength - but pain free  Cont with  2 lbs for wristflexion 10 reps, place and hold extention         OT Education - 01/27/20 1128    Education Details  HEP change and what to focus on    Person(s) Educated  Patient    Methods  Explanation;Demonstration;Tactile cues;Verbal cues;Handout     Comprehension  Returned demonstration;Verbalized understanding;Verbal cues required       OT Short Term Goals - 12/06/19 1814      OT SHORT TERM GOAL #1   Title  Pt to be Ind in HEP to increase digits AROM in flexion and extention to WNL    Baseline  extention of digits -15 at [facility] and PIP of 5th , flexion MC's 45-65 degrees , PIP's 90 to 100    Time  3    Period  Weeks    Status  New    Target Date  12/27/19      OT SHORT TERM GOAL #2   Title  Pt to be ind in HEP to increase AROM in R wrist to be able to wean out of splint and use hand in ADL's    Baseline  splint when up and about- wrist decrease flexion 22, ext 32 ,sup 75    Time  4    Period  Weeks    Status  New    Target Date  01/03/20        OT Long Term Goals - 12/06/19 1817      OT LONG TERM GOAL #1   Title  Pt AROM in R wrist increase in all planes more tan 75% compare to R wrist to use hand in more than 75% of function on PRWHE    Baseline  Pt only using hand in 16 % of function onPRWHE -and AROM decrease greatly - see flowsheet    Time  8    Period  Weeks    Status  New    Target Date  01/31/20      OT LONG TERM GOAL #2   Title  R grip and prehension strenght increase to more than 60% compare to L to cut food, hold plate and carry more than 5 lbs    Baseline  NT 5 1/2 wks/s/p - only using hand 16 % in function on PRHWE    Time  8    Period  Weeks    Status  New    Target Date  01/31/20  OT LONG TERM GOAL #3   Title  Function on PRWHE improve with more than 30 points    Baseline  function score on PRWHE 42/50    Time  8    Period  Weeks    Status  New    Target Date  01/31/20            Plan - 01/27/20 1128    Clinical Impression Statement  Pt is 12 wks s/p R distal radius fx with ORIF - showed progress since last week in composite flexion of 4th and 5th , grip strength, wrist flexion/extention - did not had pain at wrist with carry 10 lbs, push and ull open heavy door- increase functional  use simulate some work activities    OT Occupational Profile and History  Problem Focused Assessment - Including review of records relating to presenting problem    Occupational performance deficits (Please refer to evaluation for details):  ADL's;IADL's;Play;Leisure;Work;Social Participation    Body Structure / Function / Physical Skills  ADL;Flexibility;ROM;UE functional use;Decreased knowledge of precautions;FMC;Scar mobility;Edema;Pain;IADL;Strength    Rehab Potential  Good    Clinical Decision Making  Limited treatment options, no task modification necessary    Comorbidities Affecting Occupational Performance:  None    Modification or Assistance to Complete Evaluation   No modification of tasks or assist necessary to complete eval    OT Frequency  2x / week    OT Duration  2 weeks    OT Treatment/Interventions  Self-care/ADL training;Therapeutic exercise;Patient/family education;Splinting;Paraffin;Fluidtherapy;Contrast Bath;Manual Therapy;Passive range of motion;Scar mobilization    Plan  assess progress with HEP    OT Home Exercise Plan  see pt instruction    Consulted and Agree with Plan of Care  Patient       Patient will benefit from skilled therapeutic intervention in order to improve the following deficits and impairments:   Body Structure / Function / Physical Skills: ADL, Flexibility, ROM, UE functional use, Decreased knowledge of precautions, FMC, Scar mobility, Edema, Pain, IADL, Strength       Visit Diagnosis: Muscle weakness (generalized)  Stiffness of right wrist, not elsewhere classified  Stiffness of right hand, not elsewhere classified  Pain in right hand    Problem List There are no problems to display for this patient.   Oletta Cohn OTR/l,CLT 01/27/2020, 12:32 PM  [place] [facility]ORTS [facility]IC[facility] [address], [place], [address] Phone: [phone]   Fax:  [phone]  Name: Veronica Anthony MRN: [id number] Date of Birth: 01-01-1961

## 2020-01-31 ENCOUNTER — Other Ambulatory Visit: Payer: Self-pay

## 2020-01-31 ENCOUNTER — Ambulatory Visit: Payer: BC Managed Care – PPO | Admitting: Occupational Therapy

## 2020-01-31 ENCOUNTER — Ambulatory Visit: Payer: BC Managed Care – PPO

## 2020-01-31 DIAGNOSIS — M5413 Radiculopathy, cervicothoracic region: Secondary | ICD-10-CM

## 2020-01-31 DIAGNOSIS — M25631 Stiffness of right wrist, not elsewhere classified: Secondary | ICD-10-CM | POA: Diagnosis not present

## 2020-01-31 DIAGNOSIS — M25511 Pain in right shoulder: Secondary | ICD-10-CM

## 2020-01-31 DIAGNOSIS — M6281 Muscle weakness (generalized): Secondary | ICD-10-CM

## 2020-01-31 NOTE — Therapy (Signed)
[facility] [address], [place], [address] Phone: [phone]   Fax:  [phone]  Physical Therapy Treatment  Patient Details  Name: Veronica Anthony MRN: [id number] Date of Birth: [birth year] Referring Provider (PT): Evon Slack, [place]   Encounter Date: 01/31/2020  PT End of Session - 01/31/20 0903    Visit Number  4    Number of Visits  13    Date for PT Re-Evaluation  03/02/20    PT Start Time  0903    PT Stop Time  0947    PT Time Calculation (min)  44 min    Activity Tolerance  Patient tolerated treatment well    Behavior During Therapy  [facility] for tasks assessed/performed       Past Medical History:  Diagnosis Date  . Complication of anesthesia    vomiting   . GERD (gastroesophageal reflux disease)   . History of kidney stones    2015  . Sleep apnea    2011     Past Surgical History:  Procedure Laterality Date  . OPEN REDUCTION INTERNAL FIXATION (ORIF) DISTAL RADIAL FRACTURE Right 11/04/2019   Procedure: OPEN REDUCTION INTERNAL FIXATION (ORIF) DISTAL RADIAL FRACTURE;  Surgeon: Kennedy Bucker, MD;  Location: [facility] ORS;  Service: Orthopedics;  Laterality: Right;  . TUBAL LIGATION  1991    There were no vitals filed for this visit.  Subjective Assessment - 01/31/20 0904    Subjective  R shoulder starts hurting quite a bit at night. Fine during the day, able to clean and do things. The pain wakes her up after about 10 mninutes of sleep. Pt sleeps on her R and L sides as well as on her back. Pt sometimes wakes up on her R side. But also wakes up on her L side or back.  Had to take a tylenol yesterday. The pain waking her up has been happening for the past 3 days. PT better able to do more things with her R arm but the night time is the issue. Feels R anterior lateral arm pain around the bone area, about 7/10 pain currently.    Pertinent History  R shoulder pain. Pain in R shoulder began after her R  hand surgery on 11/04/2019. Pt would feel pain in his R anterior lateral forearm, biceps area, and poisterior shoulder. Pt uses ice in her R upper arm helps relax the muscles. Used to feelt a muscle tension R anterior latarl arm which is becoming smaller and smaller. Feels pain when reaching towards the side and up. Worst pain is when reaching up.  Bones hurt at night. R wrist fracture is almost completely healed. R UE pain has gotten a little better. However it continues to bother her at night when she sleeps on her R or L sides.    Patient Stated Goals  No pain, when sleepin either on her R or L sides.    Currently in Pain?  Yes    Pain Score  7     Pain Onset  More than a month ago                                PT Education - 01/31/20 1057    Education Details  ther-ex    [employer]) Educated  Patient    Methods  Explanation;Demonstration;Tactile cues;Verbal cues    Comprehension  Returned demonstration;Verbalized understanding  Objective  Pt states that its ok, she needs an interpreter.    No pain with cooking, cleaning. However, when she stops doing her daily activities, she has pain.    Feb 04, 2020 is her next MD appointment and pt is supposed to be healed.   Reproduction of R shoulder pain with palpation to R infraspinatus             Reproduction of R UE pain with shoulder ER (infraspinatus muscle contraction palpated)   Pain located along R radial nerve distribution as well as at acromion process area  no pain with IR MMT  R forearm pain decreasd with R radial nerve neural tension testing     R wrist fracture  Interpreter: Jacqui    [employer]     Manual therapy  Seated A to P to R glenohumeral joint grade 3-   Decreased pain with ER an IR isometrics afterwards  Seated STM R infraspinatus muscle             No R anterior lateral arm pain afterwards at rest  setaed STM R teres major muscle   Decreased R arm pain  with shoulder flexion    Therapeutic exercise  Cervical AROM all planes  L cervical side bend: R upper trap area discomfort  Manually resisted R shoulder ER isometrics 4/5 with reproduction of R lateral arm pain. Most reproduction of pain  Manually resisted R shoulder IR isometrics 4/5 with reprodution of R lateral arm pain  Seated R elbow extension isometrics   10x5 seconds   Seated manually resisted R scapular retraction targeting lower trap 10x5 seconds for 2 sets   R shoulder flexion with PT assist for scapular retraction. Decreased R shoulder pain with flexion      Improved exercise technique, movement at target joints, use of target muscles after mod verbal, visual, tactile cues.   Response to treatment Decreased R arm pain after session   Clinical impression Decreased R anterior lateral arm pain at rest and with shoulder flexion with treatment to promote posterior glide to humeral head, as well as decreasing muscle tension in infraspinatus and teres major muscles as well as promoting scapular retraction with shoulder flexion. Pt tolerated session well without aggravation of symptoms. Pt will benefit from continued skilled physical therapy services to decrease pain, improve ROM, strength and function.     PT Short Term Goals - 01/17/20 1717      PT SHORT TERM GOAL #1   Title  Pt will be independent with her HEP to decrease pain, improve AROM and ability to reach without pain.    Time  3    Period  Weeks    Status  New    Target Date  02/10/20        PT Long Term Goals - 01/17/20 1718      PT LONG TERM GOAL #1   Title  Patient will have a decrease in R shoulder and UE pain to 2/10 or less at worst to promote ability to raise her arm up as well as reach more comfortably.    Baseline  5/10 R shoulder pain at most for the past 2 months (01/17/2020)    Time  6    Period  Weeks    Status  New    Target Date  03/02/20      PT LONG TERM GOAL #2   Title   Patient will improve R shoulder strength by at least 1/2 MMT grade  to promote ability to perform tasks with her R UE with less difficulty.    Time  6    Period  Weeks    Status  New    Target Date  03/02/20      PT LONG TERM GOAL #3   Title  Pt will improve her shoulder FOTO score by at least 10 points as a demonstration of improved function.    Baseline  shoulder FOTO: 38 (01/17/2020)    Time  6    Period  Weeks    Status  New    Target Date  03/02/20      PT LONG TERM GOAL #4   Title  Patient will improve R shoulder flexion and abduction AROM by at least 15 degrees to promote ability to reach with less difficulty.    Baseline  AROM R shoulder: flexion 133 degrees, abduction 77 degrees (01/17/2020)    Time  6    Period  Weeks    Status  New    Target Date  03/02/20            Plan - 01/31/20 1058    Clinical Impression Statement  Decreased R anterior lateral arm pain at rest and with shoulder flexion with treatment to promote posterior glide to humeral head, as well as decreasing muscle tension in infraspinatus and teres major muscles as well as promoting scapular retraction with shoulder flexion. Pt tolerated session well without aggravation of symptoms. Pt will benefit from continued skilled physical therapy services to decrease pain, improve ROM, strength and function.    Personal Factors and Comorbidities  Comorbidity 1;Time since onset of injury/illness/exacerbation;Profession    Comorbidities  R wrist fx    Examination-Activity Limitations  Bathing;Reach Overhead;Dressing;Hygiene/Grooming;Toileting;Lift;Carry    Stability/Clinical Decision Making  Stable/Uncomplicated    Rehab Potential  Fair    PT Frequency  2x / week    PT Duration  6 weeks    PT Treatment/Interventions  Therapeutic activities;Therapeutic exercise;Neuromuscular re-education;Patient/family education;Manual techniques;Dry needling;Aquatic Therapy;Electrical Stimulation;Iontophoresis 4mg /ml  Dexamethasone;Ultrasound    PT Next Visit Plan  scapular strengthening, thoracic extension, glenohumeral control, neural flossing, manual techniques, modalities PRN    Consulted and Agree with Plan of Care  Patient       Patient will benefit from skilled therapeutic intervention in order to improve the following deficits and impairments:  Pain, Improper body mechanics, Decreased mobility, Increased muscle spasms, Postural dysfunction, Decreased activity tolerance, Decreased endurance, Decreased range of motion, Decreased strength, Hypomobility, Difficulty walking  Visit Diagnosis: Muscle weakness (generalized)  Radiculopathy, cervicothoracic region  Acute pain of right shoulder     Problem List There are no problems to display for this patient.   Joneen Boers PT, DPT   01/31/2020, 11:05 AM  [place] [facility] [address], [place], [address] Phone: [phone]   Fax:  [phone]  Name: Renelda Kilian MRN: [id number] Date of Birth: [birth year]

## 2020-01-31 NOTE — Patient Instructions (Signed)
Pt was recommended to retract her scapula when raising her R arm up

## 2020-02-03 ENCOUNTER — Other Ambulatory Visit: Payer: Self-pay

## 2020-02-03 ENCOUNTER — Ambulatory Visit: Payer: BC Managed Care – PPO

## 2020-02-03 ENCOUNTER — Ambulatory Visit: Payer: BC Managed Care – PPO | Admitting: Occupational Therapy

## 2020-02-03 ENCOUNTER — Encounter: Payer: Self-pay | Admitting: Occupational Therapy

## 2020-02-03 DIAGNOSIS — R208 Other disturbances of skin sensation: Secondary | ICD-10-CM

## 2020-02-03 DIAGNOSIS — M6281 Muscle weakness (generalized): Secondary | ICD-10-CM

## 2020-02-03 DIAGNOSIS — M25511 Pain in right shoulder: Secondary | ICD-10-CM

## 2020-02-03 DIAGNOSIS — M25641 Stiffness of right hand, not elsewhere classified: Secondary | ICD-10-CM

## 2020-02-03 DIAGNOSIS — M5413 Radiculopathy, cervicothoracic region: Secondary | ICD-10-CM

## 2020-02-03 DIAGNOSIS — M25531 Pain in right wrist: Secondary | ICD-10-CM

## 2020-02-03 DIAGNOSIS — M25631 Stiffness of right wrist, not elsewhere classified: Secondary | ICD-10-CM | POA: Diagnosis not present

## 2020-02-03 DIAGNOSIS — M79641 Pain in right hand: Secondary | ICD-10-CM

## 2020-02-03 NOTE — Therapy (Signed)
[facility] [address] [place], [address] Phone: [phone]   Fax:  [phone]  Occupational Therapy Treatment  Patient Details  Name: Veronica Anthony MRN: [id number] Date of Birth: 16-Oct-1960 No data recorded  Encounter Date: 02/03/2020  OT End of Session - 02/03/20 1859    Visit Number  16    Number of Visits  18    Date for OT Re-Evaluation  03/03/20    OT Start Time  1115    OT Stop Time  1200    OT Time Calculation (min)  45 min    Activity Tolerance  Patient tolerated treatment well    Behavior During Therapy  [facility] for tasks assessed/performed       Past Medical History:  Diagnosis Date  . Complication of anesthesia    vomiting   . GERD (gastroesophageal reflux disease)   . History of kidney stones    2015  . Sleep apnea    2011     Past Surgical History:  Procedure Laterality Date  . OPEN REDUCTION INTERNAL FIXATION (ORIF) DISTAL RADIAL FRACTURE Right 11/04/2019   Procedure: OPEN REDUCTION INTERNAL FIXATION (ORIF) DISTAL RADIAL FRACTURE;  Surgeon: Hessie Knows, MD;  Location: [facility] ORS;  Service: Orthopedics;  Laterality: Right;  . TUBAL LIGATION  1991    There were no vitals filed for this visit.  Subjective Assessment - 02/03/20 1154    Subjective   Patient reports she is pleased with her progress, feels she is doing better.  Still has some stiffness in her right shoulder with mild pain but seeing PT to address shoulder issues.    Patient is accompanied by:  Interpreter    Pertinent History  Pt fell on 2/10 - close reduction in ER was done - but then needed -  ORIF of the right distal radius. Date of surgery was 11/03/2018 - refer to hand therap because of stiffness and scar massage    Patient Stated Goals  I want to be able to use my R dominant hand and wrist so I can go back to work , work in yard, drive , cook and ride bike    Currently in Pain?  Yes    Pain Score  5     Pain  Location  Shoulder    Pain Orientation  Right    Pain Descriptors / Indicators  Aching    Pain Type  Acute pain    Pain Onset  More than a month ago    Pain Frequency  Intermittent    Multiple Pain Sites  No         [facility] OT Assessment - 02/03/20 0001      AROM   Right Forearm Pronation  90 Degrees    Right Forearm Supination  90 Degrees    Right Wrist Extension  55 Degrees    Right Wrist Flexion  50 Degrees    Right Wrist Radial Deviation  20 Degrees    Right Wrist Ulnar Deviation  30 Degrees    Left Wrist Extension  75 Degrees    Left Wrist Flexion  90 Degrees    Left Wrist Radial Deviation  20 Degrees    Left Wrist Ulnar Deviation  32 Degrees      Strength   Right Hand Grip (lbs)  25    Right Hand Lateral Pinch  10 lbs    Right Hand 3 Point Pinch  11 lbs  Left Hand Grip (lbs)  56    Left Hand Lateral Pinch  14 lbs    Left Hand 3 Point Pinch  14 lbs      Right Hand AROM   R Thumb MCP 0-60  60 Degrees    R Thumb IP 0-80  55 Degrees    R Index  MCP 0-90  90 Degrees    R Index PIP 0-100  90 Degrees    R Long  MCP 0-90  90 Degrees    R Long PIP 0-100  90 Degrees    R Ring  MCP 0-90  80 Degrees    R Ring PIP 0-100  95 Degrees    R Little  MCP 0-90  80 Degrees    R Little PIP 0-100  100 Degrees      Paraffin to right hand for 10 mins prior to ROM and manual skills, wrist flexion and extension stretch during paraffin.  Manual therapy: Graston tool nr 2 done for brushing in volar 4th and 5th digit-and palm and sweeping on volar forearm and wrist prior to ROM Manual soft tissue carpal and MC spreads.  No tenderness or pain over A1 pulley of 3rd digit.     Review again with pt wearing and use ofknuckle bender- with 5 rubber bands on ulnar side , and 3 on radial side -pt to use 3 x day for 5 min - slight pull - and follow with AROM composite fist.  Patient had 3 on ulnar side and 2 on radial side, patient added appropriate bands with cues.    Continued  reinforcement tofocus wrist flexion stretch2 min x 3, extension 1 min x 2 AROM for composite fist to palm - reinforce need to be composite and focus oncomposite flexion of digits4th and 5th- prior to putty  Upgrade to teal med putty for composite grip strength - but pain free  Cont with2 lbs for wristflexion 10 reps, place and hold extension  Strength and ROM measurements as noted in flowsheet.  Picking up and carrying 5# weight to door and back, 6# weight, 8# and then 10#.  No pain with weights except with 10#, mild pain in right forearm.    Response to tx:   Patient has continued to make good progress, reports she feels her right hand is comparable to her left hand now and pleased with her progress.  She is able to lift up to 8# with no pain using right hand, initiation of mild pain with 10# weight.  Patient's right wrist improved for return to work from [employer].  Pt still being seen by PT for right shoulder pain issues.  Will plan to follow up 1-2 visits to determine any need for upgrades for home program as well as any potential recommendations or modifications for work related tasks.                  OT Education - 02/03/20 1859    Education Details  HEP, progress    Person(s) Educated  Patient    Methods  Explanation;Demonstration;Tactile cues;Verbal cues;Handout    Comprehension  Returned demonstration;Verbalized understanding;Verbal cues required       OT Short Term Goals - 02/03/20 1937      OT SHORT TERM GOAL #1   Title  Pt to be Ind in HEP to increase digits AROM in flexion and extention to WNL    Baseline  extention of digits -15 at [facility] and PIP of 5th , flexion  MC's 45-65 degrees , PIP's 90 to 100    Time  3    Period  Weeks    Status  On-going    Target Date  12/27/19      OT SHORT TERM GOAL #2   Title  Pt to be ind in HEP to increase AROM in R wrist to be able to wean out of splint and use hand in ADL's    Baseline  splint when up and  about- wrist decrease flexion 22, ext 32 ,sup 75    Time  4    Period  Weeks    Status  Achieved    Target Date  01/03/20        OT Long Term Goals - 02/03/20 1901      OT LONG TERM GOAL #1   Title  Pt AROM in R wrist increase in all planes more tan 75% compare to R wrist to use hand in more than 75% of function on PRWHE    Baseline  Pt only using hand in 16 % of function onPRWHE -and AROM decrease greatly - see flowsheet    Time  4    Period  Weeks    Status  On-going    Target Date  03/03/20      OT LONG TERM GOAL #2   Title  R grip and prehension strenght increase to more than 60% compare to L to cut food, hold plate and carry more than 5 lbs    Baseline  NT 5 1/2 wks/s/p - only using hand 16 % in function on PRHWE    Time  4    Period  Weeks    Status  Achieved      OT LONG TERM GOAL #3   Title  Function on PRWHE improve with more than 30 points    Baseline  function score on PRWHE 42/50    Time  4    Period  Weeks    Status  On-going            Plan - 02/03/20 1900    Clinical Impression Statement  Patient has continued to make good progress, reports she feels her right hand is comparable to her left hand now and pleased with her progress.  She is able to lift up to 8# with no pain using right hand, initiation of mild pain with 10# weight.  Patient's right wrist improved for return to work from [employer].  Pt still being seen by PT for right shoulder pain issues.  Will plan to follow up 1-2 visits to determine any need for upgrades for home program as well as any potential recommendations or modifications for work related tasks.    OT Occupational Profile and History  Problem Focused Assessment - Including review of records relating to presenting problem    Occupational performance deficits (Please refer to evaluation for details):  ADL's;IADL's;Play;Leisure;Work;Social Participation    Body Structure / Function / Physical Skills  ADL;Flexibility;ROM;UE  functional use;Decreased knowledge of precautions;FMC;Scar mobility;Edema;Pain;IADL;Strength    Rehab Potential  Good    Clinical Decision Making  Limited treatment options, no task modification necessary    Comorbidities Affecting Occupational Performance:  None    Modification or Assistance to Complete Evaluation   No modification of tasks or assist necessary to complete eval    OT Frequency  Biweekly    OT Duration  4 weeks    OT Treatment/Interventions  Self-care/ADL training;Therapeutic exercise;Patient/family education;Splinting;Paraffin;Fluidtherapy;Contrast Bath;Manual Therapy;Passive range  of motion;Scar mobilization    Plan  assess progress with HEP    OT Home Exercise Plan  see pt instruction    Consulted and Agree with Plan of Care  Patient       Patient will benefit from skilled therapeutic intervention in order to improve the following deficits and impairments:   Body Structure / Function / Physical Skills: ADL, Flexibility, ROM, UE functional use, Decreased knowledge of precautions, FMC, Scar mobility, Edema, Pain, IADL, Strength       Visit Diagnosis: Muscle weakness (generalized)  Stiffness of right wrist, not elsewhere classified  Stiffness of right hand, not elsewhere classified  Pain in right hand  Pain in right wrist  Other disturbances of skin sensation    Problem List There are no problems to display for this patient.  Becker Christopher Cornelius Moras, OTR/L, CLT  Standley Bargo 02/03/2020, 7:41 PM  [facility] [address] [place], [address] Phone: [phone]   Fax:  [phone]  Name: Veronica Anthony MRN: [id number] Date of Birth: 08/11/1961

## 2020-02-03 NOTE — Therapy (Signed)
[facility] [address] [place], [address] Phone: [phone]   Fax:  [phone]  Physical Therapy Treatment  Patient Details  Name: Veronica Anthony MRN: [id number] Date of Birth: October 02, 1960 Referring Provider (PT): Evon Slack, [place]   Encounter Date: 02/03/2020  PT End of Session - 02/03/20 1636    Visit Number  5    Number of Visits  13    Date for PT Re-Evaluation  03/02/20    PT Start Time  1636    PT Stop Time  1721    PT Time Calculation (min)  45 min    Activity Tolerance  Patient tolerated treatment well    Behavior During Therapy  [facility] for tasks assessed/performed       Past Medical History:  Diagnosis Date  . Complication of anesthesia    vomiting   . GERD (gastroesophageal reflux disease)   . History of kidney stones    2015  . Sleep apnea    2011     Past Surgical History:  Procedure Laterality Date  . OPEN REDUCTION INTERNAL FIXATION (ORIF) DISTAL RADIAL FRACTURE Right 11/04/2019   Procedure: OPEN REDUCTION INTERNAL FIXATION (ORIF) DISTAL RADIAL FRACTURE;  Surgeon: Kennedy Bucker, MD;  Location: [facility] ORS;  Service: Orthopedics;  Laterality: Right;  . TUBAL LIGATION  1991    There were no vitals filed for this visit.  Subjective Assessment - 02/03/20 1637    Subjective  Uses a heating pad for her shoulder which is working pretty good at the recommended spots. Sleeping better, less pain. Moving her R arm to the side pretty fast, she feels pain. Feels like she is progressing. 5/10 R shoulder pain wiht flexion.    Pertinent History  R shoulder pain. Pain in R shoulder began after her R hand surgery on 11/04/2019. Pt would feel pain in his R anterior lateral forearm, biceps area, and poisterior shoulder. Pt uses ice in her R upper arm helps relax the muscles. Used to feelt a muscle tension R anterior latarl arm which is becoming smaller and smaller. Feels pain when reaching towards the  side and up. Worst pain is when reaching up.  Bones hurt at night. R wrist fracture is almost completely healed. R UE pain has gotten a little better. However it continues to bother her at night when she sleeps on her R or L sides.    Patient Stated Goals  No pain, when sleepin either on her R or L sides.    Currently in Pain?  Yes    Pain Score  5     Pain Onset  More than a month ago                                PT Education - 02/03/20 2023    Education Details  ther-ex    Person(s) Educated  Patient    Methods  Explanation;Demonstration;Tactile cues;Verbal cues    Comprehension  Returned demonstration;Verbalized understanding      Objective   No pain with cooking, cleaning. However, when she stops doing her daily activities, she has pain.    Feb 04, 2020 is her next MD appointment and pt is supposed to be healed.   Reproduction of R shoulder pain with palpation to R infraspinatus Reproduction of R UE pain with shoulder ER (infraspinatus muscle contraction palpated)  Pain located along R radial nerve  distribution as well as at acromion process area  no pain with IR MMT  R forearm pain decreasd with R radial nerve neural tension testing    R wrist fracture  Interpreter: Chauncey Cruel  [employer]    Manual therapy  Supine with R shoulder in slight abduction, grade 3- inferior and posterior glide R shoulder joint  Supine with shoulder in about 90 degrees flexion  STM R teres major muscle   STM R distal pectoralis muscle    Therapeutic exercise  Supine R shoulder flexion A/AROM 4x, then 10x Supine R shoulder scaption AAROM with PT 10x Supine R shoulder abduction AAROM with PT 10x  Seated manually resisted R scapular retraction targeting the lower trap muscles 10x5 seconds for 3 sets  Improved R shoulder flexion AROM and comfort  Seated manually resisted R elbow extension isometrics at 90 degrees  flexion 10x5 seconds  Seated manually resisted IR isometrics 10x5 seconds for 2 sets   Improved exercise technique, movement at target joints, use of target muscles after mod verbal, visual, tactile cues.    Response to treatment Decreased R arm pain after session reported by pt.    Clinical impression Decreased R shoulder pain with flexion with treatment to promote joint mobility,  decreasing teres major muscle tension, improving scapular and IR muscle use. Pt tolerated session well without aggravation of symptoms. Pt will benefit from continued skilled physical therapy services to decrease pain, improve ROM, strength and function.       PT Short Term Goals - 01/17/20 1717      PT SHORT TERM GOAL #1   Title  Pt will be independent with her HEP to decrease pain, improve AROM and ability to reach without pain.    Time  3    Period  Weeks    Status  New    Target Date  02/10/20        PT Long Term Goals - 01/17/20 1718      PT LONG TERM GOAL #1   Title  Patient will have a decrease in R shoulder and UE pain to 2/10 or less at worst to promote ability to raise her arm up as well as reach more comfortably.    Baseline  5/10 R shoulder pain at most for the past 2 months (01/17/2020)    Time  6    Period  Weeks    Status  New    Target Date  03/02/20      PT LONG TERM GOAL #2   Title  Patient will improve R shoulder strength by at least 1/2 MMT grade to promote ability to perform tasks with her R UE with less difficulty.    Time  6    Period  Weeks    Status  New    Target Date  03/02/20      PT LONG TERM GOAL #3   Title  Pt will improve her shoulder FOTO score by at least 10 points as a demonstration of improved function.    Baseline  shoulder FOTO: 38 (01/17/2020)    Time  6    Period  Weeks    Status  New    Target Date  03/02/20      PT LONG TERM GOAL #4   Title  Patient will improve R shoulder flexion and abduction AROM by at least 15 degrees to promote ability  to reach with less difficulty.    Baseline  AROM R shoulder: flexion 133 degrees,  abduction 77 degrees (01/17/2020)    Time  6    Period  Weeks    Status  New    Target Date  03/02/20            Plan - 02/03/20 2024    Clinical Impression Statement  Decreased R shoulder pain with flexion with treatment to promote joint mobility,  decreasing teres major muscle tension, improving scapular and IR muscle use. Pt tolerated session well without aggravation of symptoms. Pt will benefit from continued skilled physical therapy services to decrease pain, improve ROM, strength and function.    Personal Factors and Comorbidities  Comorbidity 1;Time since onset of injury/illness/exacerbation;Profession    Comorbidities  R wrist fx    Examination-Activity Limitations  Bathing;Reach Overhead;Dressing;Hygiene/Grooming;Toileting;Lift;Carry    Stability/Clinical Decision Making  Stable/Uncomplicated    Rehab Potential  Fair    PT Frequency  2x / week    PT Duration  6 weeks    PT Treatment/Interventions  Therapeutic activities;Therapeutic exercise;Neuromuscular re-education;Patient/family education;Manual techniques;Dry needling;Aquatic Therapy;Electrical Stimulation;Iontophoresis 4mg /ml Dexamethasone;Ultrasound    PT Next Visit Plan  scapular strengthening, thoracic extension, glenohumeral control, neural flossing, manual techniques, modalities PRN    Consulted and Agree with Plan of Care  Patient       Patient will benefit from skilled therapeutic intervention in order to improve the following deficits and impairments:  Pain, Improper body mechanics, Decreased mobility, Increased muscle spasms, Postural dysfunction, Decreased activity tolerance, Decreased endurance, Decreased range of motion, Decreased strength, Hypomobility, Difficulty walking  Visit Diagnosis: Muscle weakness (generalized)  Acute pain of right shoulder  Radiculopathy, cervicothoracic region     Problem List There are no  problems to display for this patient.   PT, DPT   02/03/2020, 8:30 PM  [facility] [address], [place] Phone: [phone]   Fax:  [phone]  Name: Veronica Anthony MRN: Rosalyn Charters Date of Birth: [birth year]

## 2020-02-10 ENCOUNTER — Other Ambulatory Visit: Payer: Self-pay

## 2020-02-10 ENCOUNTER — Ambulatory Visit: Payer: BC Managed Care – PPO

## 2020-02-10 ENCOUNTER — Ambulatory Visit: Payer: BC Managed Care – PPO | Admitting: Occupational Therapy

## 2020-02-10 DIAGNOSIS — M25531 Pain in right wrist: Secondary | ICD-10-CM

## 2020-02-10 DIAGNOSIS — M5413 Radiculopathy, cervicothoracic region: Secondary | ICD-10-CM

## 2020-02-10 DIAGNOSIS — M6281 Muscle weakness (generalized): Secondary | ICD-10-CM

## 2020-02-10 DIAGNOSIS — M25631 Stiffness of right wrist, not elsewhere classified: Secondary | ICD-10-CM

## 2020-02-10 DIAGNOSIS — M79641 Pain in right hand: Secondary | ICD-10-CM

## 2020-02-10 DIAGNOSIS — M25641 Stiffness of right hand, not elsewhere classified: Secondary | ICD-10-CM

## 2020-02-10 DIAGNOSIS — M25511 Pain in right shoulder: Secondary | ICD-10-CM

## 2020-02-10 NOTE — Therapy (Signed)
[facility] [address] [place], [address] Phone: [phone]   Fax:  [phone]  Occupational Therapy Treatment  Patient Details  Name: Veronica Anthony MRN: [id number] Date of Birth: 08-19-1961 No data recorded  Encounter Date: 02/10/2020  OT End of Session - 02/10/20 1544    Visit Number  17    Number of Visits  18    Date for OT Re-Evaluation  03/03/20    OT Start Time  1118    OT Stop Time  1200    OT Time Calculation (min)  42 min    Activity Tolerance  Patient tolerated treatment well    Behavior During Therapy  [facility] for tasks assessed/performed       Past Medical History:  Diagnosis Date  . Complication of anesthesia    vomiting   . GERD (gastroesophageal reflux disease)   . History of kidney stones    2015  . Sleep apnea    2011     Past Surgical History:  Procedure Laterality Date  . OPEN REDUCTION INTERNAL FIXATION (ORIF) DISTAL RADIAL FRACTURE Right 11/04/2019   Procedure: OPEN REDUCTION INTERNAL FIXATION (ORIF) DISTAL RADIAL FRACTURE;  Surgeon: Hessie Knows, MD;  Location: [facility] ORS;  Service: Orthopedics;  Laterality: Right;  . TUBAL LIGATION  1991    There were no vitals filed for this visit.  Subjective Assessment - 02/10/20 1542    Subjective   Hand and wrist doing great - still doing my exercises -suppose to go back to work 11th June - still 2 wks for my shoulder -and appt end of June with Dr Rudene Christians    Pertinent History  Pt fell on 2/10 - close reduction in ER was done - but then needed -  ORIF of the right distal radius. Date of surgery was 11/03/2018 - refer to hand therap because of stiffness and scar massage    Patient Stated Goals  I want to be able to use my R dominant hand and wrist so I can go back to work , work in yard, drive , cook and ride bike    Currently in Pain?  No/denies         [facility] OT Assessment - 02/10/20 0001      AROM   Right Wrist Extension  60  Degrees    Right Wrist Flexion  43 Degrees   in sesion 60     Strength   Right Hand Grip (lbs)  30    Right Hand Lateral Pinch  13 lbs    Right Hand 3 Point Pinch  11 lbs    Left Hand Grip (lbs)  46    Left Hand Lateral Pinch  15 lbs    Left Hand 3 Point Pinch  15 lbs      Right Hand AROM   R Index  MCP 0-90  90 Degrees    R Index PIP 0-100  90 Degrees    R Long  MCP 0-90  90 Degrees    R Long PIP 0-100  100 Degrees    R Ring  MCP 0-90  90 Degrees    R Ring PIP 0-100  100 Degrees    R Little  MCP 0-90  85 Degrees    R Little PIP 0-100  100 Degrees        Pt arrive - had appt with surgeon - return to work 11th June- to cont shoulder therapy She arrive with  limited AROM composite fist to 4th and 5th - but after reminding her and doing it several times and demonstrate - she was able to do composite to 85 degrees flexion at 5th MC  Pt to cont with knuckle bender after heat until she has composite fist  Assess use of teal putty- to easy - upgrade to [employer] - pt to only do 3 sec at time gripping into putty with 4th and 5th  15 reps  And add lat and 3 point grip - 14 reps -2 x day -increase in week to 2 sets - 2 x day  Wrist strength in all planes in the range she has - is 4+/5  Wrist flexion cont to be in 40's  and extention 60 -  Grip strength increase - see flow sheet  Ask pt to verbalize her HEP - pt was still doing some of the stretches that was stopped 2 wks ago         Pt to only focus on wrist flexion and extension done wrist flexion stretch and extension over the armrest or edge of table 2 min x 3, extention 1 min x 2 -  able to demo correctly this date No pain or tenderness over 3rd A1pulley-and decrease stiffness or pain  Cont with 2 lbs for wrist flexion 10 reps, place and hold extention RD, UD, sup, pro 3 sets of 12 and putty until she return to work  And then to do only stretches for 5th digit flexion and wrist flexion, ext stretch -to follow up in month - prior  to appt with surgeon and after returning to work                  OT Education - 02/10/20 1542    Education Details  HEP ,progress    Person(s) Educated  Patient    Methods  Explanation;Demonstration;Tactile cues;Verbal cues;Handout    Comprehension  Returned demonstration;Verbalized understanding;Verbal cues required       OT Short Term Goals - 02/03/20 1937      OT SHORT TERM GOAL #1   Title  Pt to be Ind in HEP to increase digits AROM in flexion and extention to WNL    Baseline  extention of digits -15 at [facility] and PIP of 5th , flexion MC's 45-65 degrees , PIP's 90 to 100    Time  3    Period  Weeks    Status  On-going    Target Date  12/27/19      OT SHORT TERM GOAL #2   Title  Pt to be ind in HEP to increase AROM in R wrist to be able to wean out of splint and use hand in ADL's    Baseline  splint when up and about- wrist decrease flexion 22, ext 32 ,sup 75    Time  4    Period  Weeks    Status  Achieved    Target Date  01/03/20        OT Long Term Goals - 02/03/20 1901      OT LONG TERM GOAL #1   Title  Pt AROM in R wrist increase in all planes more tan 75% compare to R wrist to use hand in more than 75% of function on PRWHE    Baseline  Pt only using hand in 16 % of function onPRWHE -and AROM decrease greatly - see flowsheet    Time  4    Period  Weeks    Status  On-going    Target Date  03/03/20      OT LONG TERM GOAL #2   Title  R grip and prehension strenght increase to more than 60% compare to L to cut food, hold plate and carry more than 5 lbs    Baseline  NT 5 1/2 wks/s/p - only using hand 16 % in function on PRHWE    Time  4    Period  Weeks    Status  Achieved      OT LONG TERM GOAL #3   Title  Function on PRWHE improve with more than 30 points    Baseline  function score on PRWHE 42/50    Time  4    Period  Weeks    Status  On-going            Plan - 02/10/20 1544    Clinical Impression Statement  Pt at 14wks s/p R ORIF  distal radius fx - making great gains this past 2 wks in composite fist at 4thand 5th - and wrist extention , flexion - grip increase - pt to cont to focus on wrist flexion , ext, and composite flexion at 5th with increase resistance iwth putty- can stop strengthenign HEP when retuning back to work in [employer] , cont with stretches until even with L wrist - will follow up one more time in month with OT    OT Occupational Profile and History  Problem Focused Assessment - Including review of records relating to presenting problem    Occupational performance deficits (Please refer to evaluation for details):  ADL's;IADL's;Play;Leisure;Work;Social Participation    Body Structure / Function / Physical Skills  ADL;Flexibility;ROM;UE functional use;Decreased knowledge of precautions;FMC;Scar mobility;Edema;Pain;IADL;Strength    Rehab Potential  Good    Clinical Decision Making  Limited treatment options, no task modification necessary    Comorbidities Affecting Occupational Performance:  None    Modification or Assistance to Complete Evaluation   No modification of tasks or assist necessary to complete eval    OT Frequency  Monthly    OT Duration  4 weeks    OT Treatment/Interventions  Self-care/ADL training;Therapeutic exercise;Patient/family education;Splinting;Paraffin;Fluidtherapy;Contrast Bath;Manual Therapy;Passive range of motion;Scar mobilization    Plan  return back to work - 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 - 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 at home    OT Home Exercise Plan  see pt instruction    Consulted and Agree with Plan of Care  Patient       Patient will benefit from skilled therapeutic intervention in order to improve the following deficits and impairments:   Body Structure / Function / Physical Skills: ADL, Flexibility, ROM, UE functional use, Decreased knowledge of precautions, FMC, Scar mobility, Edema, Pain, IADL, Strength       Visit Diagnosis: Muscle weakness (generalized)  Stiffness of right wrist,  not elsewhere classified  Stiffness of right hand, not elsewhere classified  Pain in right hand  Pain in right wrist    Problem List There are no problems to display for this patient.   Oletta Cohn  OTR/L,CLT 02/10/2020, 3:48 PM  [facility] PHYSICAL AND SPORTS MEDICINE [address] [place], [address] Phone: [phone]   Fax:  [phone]  Name: Ashaya Raftery MRN: [id number] Date of Birth: December 09, 1960

## 2020-02-10 NOTE — Therapy (Signed)
[facility] [address], [place], [address] Phone: [phone]   Fax:  [phone]  Physical Therapy Treatment  Patient Details  Name: Veronica Anthony MRN: [id number] Date of Birth: [birth year] Referring Provider (PT): Evon Slack, [place]   Encounter Date: 02/10/2020  PT End of Session - 02/10/20 1121    Visit Number  6    Number of Visits  13    Date for PT Re-Evaluation  03/02/20    PT Start Time  1121    PT Stop Time  1203    PT Time Calculation (min)  42 min    Activity Tolerance  Patient tolerated treatment well    Behavior During Therapy  [facility] for tasks assessed/performed       Past Medical History:  Diagnosis Date  . Complication of anesthesia    vomiting   . GERD (gastroesophageal reflux disease)   . History of kidney stones    2015  . Sleep apnea    2011     Past Surgical History:  Procedure Laterality Date  . OPEN REDUCTION INTERNAL FIXATION (ORIF) DISTAL RADIAL FRACTURE Right 11/04/2019   Procedure: OPEN REDUCTION INTERNAL FIXATION (ORIF) DISTAL RADIAL FRACTURE;  Surgeon: Kennedy Bucker, MD;  Location: [facility] ORS;  Service: Orthopedics;  Laterality: Right;  . TUBAL LIGATION  1991    There were no vitals filed for this visit.  Subjective Assessment - 02/10/20 1122    Subjective  R arm is a lot better. Hurts less than before. 4-5/10 R shoulder pain when raising her R arm up.    Pertinent History  R shoulder pain. Pain in R shoulder began after her R hand surgery on 11/04/2019. Pt would feel pain in his R anterior lateral forearm, biceps area, and poisterior shoulder. Pt uses ice in her R upper arm helps relax the muscles. Used to feelt a muscle tension R anterior latarl arm which is becoming smaller and smaller. Feels pain when reaching towards the side and up. Worst pain is when reaching up.  Bones hurt at night. R wrist fracture is almost completely healed. R UE pain has gotten a little  better. However it continues to bother her at night when she sleeps on her R or L sides.    Patient Stated Goals  No pain, when sleepin either on her R or L sides.    Currently in Pain?  Yes    Pain Score  5     Pain Onset  More than a month ago                                PT Education - 02/10/20 1148    Education Details  ther-ex    [employer]) Educated  Patient    Methods  Explanation;Demonstration;Tactile cues;Verbal cues    Comprehension  Returned demonstration;Verbalized understanding      Objective   No pain with cooking, cleaning. However, when she stops doing her daily activities, she has pain.    Reproduction of R shoulder pain with palpation to R infraspinatus Reproduction of R UE pain with shoulder ER (infraspinatus muscle contraction palpated)  Pain located along R radial nerve distribution as well as at acromion process area  no pain with IR MMT  R forearm pain decreasd with R radial nerve neural tension testing    R wrist fracture, mostly healed per pt  Interpreter:Loyda  [employer]  Manual therapy   Supine with R shoulder in slight abduction, grade 3- to 3 inferior and posterior glide R shoulder joint  Supine with shoulder in about 90 degrees flexion             STM R teres major muscle     Therapeutic exercise  Supine R shoulder flexion AAROM 10x3 Supine R shoulder scaption AAROM with PT 10x3 Supine R shoulder abduction AAROM with PT 10x  In scapular plane AAROM  ER 10x3  IR 10x3   Seated R cross arm stretch 10 seconds x 3. Increased discomfort, eases with rest  Seated R shoulder inferior distraction by PT 10x2 with 5-10 second holds.      Improved exercise technique, movement at target joints, use of target muscles after mod verbal, visual, tactile cues.    Response to treatment Decreased R shoulder pain with flexion, improved AROM observed after  treatment   Clinical impression  Improving R shoulder level of comfort based on subjective reports. Continued working on improving R shoulder joint mobility, decreasing muscle tension to promote ability to raise her R arm up with less pain. Pt will benefit from continued skilled physical therapy services to decrease pain, improve ROM, strength and function.   PT Short Term Goals - 01/17/20 1717      PT SHORT TERM GOAL #1   Title  Pt will be independent with her HEP to decrease pain, improve AROM and ability to reach without pain.    Time  3    Period  Weeks    Status  New    Target Date  02/10/20        PT Long Term Goals - 01/17/20 1718      PT LONG TERM GOAL #1   Title  Patient will have a decrease in R shoulder and UE pain to 2/10 or less at worst to promote ability to raise her arm up as well as reach more comfortably.    Baseline  5/10 R shoulder pain at most for the past 2 months (01/17/2020)    Time  6    Period  Weeks    Status  New    Target Date  03/02/20      PT LONG TERM GOAL #2   Title  Patient will improve R shoulder strength by at least 1/2 MMT grade to promote ability to perform tasks with her R UE with less difficulty.    Time  6    Period  Weeks    Status  New    Target Date  03/02/20      PT LONG TERM GOAL #3   Title  Pt will improve her shoulder FOTO score by at least 10 points as a demonstration of improved function.    Baseline  shoulder FOTO: 38 (01/17/2020)    Time  6    Period  Weeks    Status  New    Target Date  03/02/20      PT LONG TERM GOAL #4   Title  Patient will improve R shoulder flexion and abduction AROM by at least 15 degrees to promote ability to reach with less difficulty.    Baseline  AROM R shoulder: flexion 133 degrees, abduction 77 degrees (01/17/2020)    Time  6    Period  Weeks    Status  New    Target Date  03/02/20            Plan - 02/10/20 1650  Clinical Impression Statement  Improving R shoulder level of  comfort based on subjective reports. Continued working on improving R shoulder joint mobility, decreasing muscle tension to promote ability to raise her R arm up with less pain. Pt will benefit from continued skilled physical therapy services to decrease pain, improve ROM, strength and function.    Personal Factors and Comorbidities  Comorbidity 1;Time since onset of injury/illness/exacerbation;Profession    Comorbidities  R wrist fx    Examination-Activity Limitations  Bathing;Reach Overhead;Dressing;Hygiene/Grooming;Toileting;Lift;Carry    Stability/Clinical Decision Making  Stable/Uncomplicated    Rehab Potential  Fair    PT Frequency  2x / week    PT Duration  6 weeks    PT Treatment/Interventions  Therapeutic activities;Therapeutic exercise;Neuromuscular re-education;Patient/family education;Manual techniques;Dry needling;Aquatic Therapy;Electrical Stimulation;Iontophoresis 4mg /ml Dexamethasone;Ultrasound    PT Next Visit Plan  scapular strengthening, thoracic extension, glenohumeral control, neural flossing, manual techniques, modalities PRN    Consulted and Agree with Plan of Care  Patient       Patient will benefit from skilled therapeutic intervention in order to improve the following deficits and impairments:  Pain, Improper body mechanics, Decreased mobility, Increased muscle spasms, Postural dysfunction, Decreased activity tolerance, Decreased endurance, Decreased range of motion, Decreased strength, Hypomobility, Difficulty walking  Visit Diagnosis: Muscle weakness (generalized)  Acute pain of right shoulder  Radiculopathy, cervicothoracic region     Problem List There are no problems to display for this patient.   PT, DPT   02/10/2020, 5:01 PM  [facility] PHYSICAL AND SPORTS MEDICINE [address], [place] Phone: [phone]   Fax:  [phone]  Name: Veronica Anthony MRN: Rosalyn Charters Date of  Birth: 15-Dec-1960

## 2020-02-10 NOTE — Patient Instructions (Signed)
See note

## 2020-02-16 ENCOUNTER — Other Ambulatory Visit: Payer: Self-pay

## 2020-02-16 ENCOUNTER — Ambulatory Visit: Payer: BC Managed Care – PPO | Attending: Orthopedic Surgery

## 2020-02-16 DIAGNOSIS — M25511 Pain in right shoulder: Secondary | ICD-10-CM

## 2020-02-16 DIAGNOSIS — M5413 Radiculopathy, cervicothoracic region: Secondary | ICD-10-CM | POA: Diagnosis present

## 2020-02-16 DIAGNOSIS — M25631 Stiffness of right wrist, not elsewhere classified: Secondary | ICD-10-CM | POA: Diagnosis present

## 2020-02-16 DIAGNOSIS — M6281 Muscle weakness (generalized): Secondary | ICD-10-CM | POA: Diagnosis present

## 2020-02-16 NOTE — Therapy (Signed)
[place] [facility] [place] [facility] [address], [place], [address] Phone: [phone]   Fax:  [phone]  Physical Therapy Treatment  Patient Details  Name: Veronica Anthony MRN: [id number] Date of Birth: [birth year] Referring Provider (PT): Evon Slack, [place]   Encounter Date: 02/16/2020  PT End of Session - 02/16/20 1520    Visit Number  7    Number of Visits  13    Date for PT Re-Evaluation  03/02/20    PT Start Time  1520    PT Stop Time  1603    PT Time Calculation (min)  43 min    Activity Tolerance  Patient tolerated treatment well    Behavior During Therapy  [facility] for tasks assessed/performed       Past Medical History:  Diagnosis Date  . Complication of anesthesia    vomiting   . GERD (gastroesophageal reflux disease)   . History of kidney stones    2015  . Sleep apnea    2011     Past Surgical History:  Procedure Laterality Date  . OPEN REDUCTION INTERNAL FIXATION (ORIF) DISTAL RADIAL FRACTURE Right 11/04/2019   Procedure: OPEN REDUCTION INTERNAL FIXATION (ORIF) DISTAL RADIAL FRACTURE;  Surgeon: Kennedy Bucker, MD;  Location: [facility] ORS;  Service: Orthopedics;  Laterality: Right;  . TUBAL LIGATION  1991    There were no vitals filed for this visit.  Subjective Assessment - 02/16/20 1522    Subjective  R shoulder is a little bit better, sometimes it hurts, sometimes it doesn't. 4/10 R shoulder pain when raising it up.    Pertinent History  R shoulder pain. Pain in R shoulder began after her R hand surgery on 11/04/2019. Pt would feel pain in his R anterior lateral forearm, biceps area, and poisterior shoulder. Pt uses ice in her R upper arm helps relax the muscles. Used to feelt a muscle tension R anterior latarl arm which is becoming smaller and smaller. Feels pain when reaching towards the side and up. Worst pain is when reaching up.  Bones hurt at night. R wrist fracture is almost completely healed. R UE pain has  gotten a little better. However it continues to bother her at night when she sleeps on her R or L sides.    Patient Stated Goals  No pain, when sleepin either on her R or L sides.    Currently in Pain?  Yes    Pain Score  4     Pain Onset  More than a month ago                                PT Education - 02/16/20 1557    Education Details  ther-ex    Person(s) Educated  Patient    Methods  Explanation;Demonstration;Tactile cues;Verbal cues    Comprehension  Verbalized understanding;Returned demonstration      Objective   No pain with cooking, cleaning. However, when she stops doing her daily activities, she has pain.    Reproduction of R shoulder pain with palpation to R infraspinatus Reproduction of R UE pain with shoulder ER (infraspinatus muscle contraction palpated)  Pain located along R radial nerve distribution as well as at acromion process area  no pain with IR MMT  R forearm pain decreasd with R radial nerve neural tension testing    R wrist fracture, mostly healed per pt  Interpreter:Eddie Sobalvarro  Medbridge    Manual therapy  Supine with R shoulder in slight abduction, grade 3- to 3 inferior and posterior glide R shoulder joint  Supine A to P to R scapula grade 3   Supine manual R adductor/cross arm stretch with PT    Supine with shoulder in about90 degrees flexion STM R teres major muscle     Therapeutic exercise  Supine R shoulder flexion AAROM 10x  Supine R shoulder scaption AAROM with PT 7x. Anterior shoulder discomfort  R shoulder isometrics, resistance away from fracture area.   IR 10x2 with 5 second holds, then 7x5 seconds   Seated R elbow extension isometrics, elbow at 90 degrees flexion, resistance away from fracture area  10x5 seconds for 3 sets  Seated R cross arm stretch 10 seconds x 10 with PT    Improved exercise technique, movement at target  joints, use of target muscles after mod verbal, visual, tactile cues.   Response to treatment Fair tolerance to today's session.    Clinical impression Continued working on decreasing R shoulder joint and soft tissue restrictions to promote better interarticular movement with arm motions to promote ability to raise her R arm up more comfortably. Improved joint mobility palpated after manual therapy. Fair tolerance to today's session. Pt will benefit from continued skilled physical therapy services to decrease pain, improve ROM, strength and function.        PT Short Term Goals - 01/17/20 1717      PT SHORT TERM GOAL #1   Title  Pt will be independent with her HEP to decrease pain, improve AROM and ability to reach without pain.    Time  3    Period  Weeks    Status  New    Target Date  02/10/20        PT Long Term Goals - 01/17/20 1718      PT LONG TERM GOAL #1   Title  Patient will have a decrease in R shoulder and UE pain to 2/10 or less at worst to promote ability to raise her arm up as well as reach more comfortably.    Baseline  5/10 R shoulder pain at most for the past 2 months (01/17/2020)    Time  6    Period  Weeks    Status  New    Target Date  03/02/20      PT LONG TERM GOAL #2   Title  Patient will improve R shoulder strength by at least 1/2 MMT grade to promote ability to perform tasks with her R UE with less difficulty.    Time  6    Period  Weeks    Status  New    Target Date  03/02/20      PT LONG TERM GOAL #3   Title  Pt will improve her shoulder FOTO score by at least 10 points as a demonstration of improved function.    Baseline  shoulder FOTO: 38 (01/17/2020)    Time  6    Period  Weeks    Status  New    Target Date  03/02/20      PT LONG TERM GOAL #4   Title  Patient will improve R shoulder flexion and abduction AROM by at least 15 degrees to promote ability to reach with less difficulty.    Baseline  AROM R shoulder: flexion 133 degrees,  abduction 77 degrees (01/17/2020)    Time  6    Period  Weeks    Status  New    Target Date  03/02/20            Plan - 02/16/20 1615    Clinical Impression Statement  Continued working on decreasing R shoulder joint and soft tissue restrictions to promote better interarticular movement with arm motions to promote ability to raise her R arm up more comfortably. Improved joint mobility palpated after manual therapy. Fair tolerance to today's session. Pt will benefit from continued skilled physical therapy services to decrease pain, improve ROM, strength and function.    Personal Factors and Comorbidities  Comorbidity 1;Time since onset of injury/illness/exacerbation;Profession    Comorbidities  R wrist fx    Examination-Activity Limitations  Bathing;Reach Overhead;Dressing;Hygiene/Grooming;Toileting;Lift;Carry    Stability/Clinical Decision Making  Stable/Uncomplicated    Rehab Potential  Fair    PT Frequency  2x / week    PT Duration  6 weeks    PT Treatment/Interventions  Therapeutic activities;Therapeutic exercise;Neuromuscular re-education;Patient/family education;Manual techniques;Dry needling;Aquatic Therapy;Electrical Stimulation;Iontophoresis 4mg /ml Dexamethasone;Ultrasound    PT Next Visit Plan  scapular strengthening, thoracic extension, glenohumeral control, neural flossing, manual techniques, modalities PRN    Consulted and Agree with Plan of Care  Patient       Patient will benefit from skilled therapeutic intervention in order to improve the following deficits and impairments:  Pain, Improper body mechanics, Decreased mobility, Increased muscle spasms, Postural dysfunction, Decreased activity tolerance, Decreased endurance, Decreased range of motion, Decreased strength, Hypomobility, Difficulty walking  Visit Diagnosis: Muscle weakness (generalized)  Radiculopathy, cervicothoracic region  Acute pain of right shoulder     Problem List There are no problems to  display for this patient.  PT, DPT   02/16/2020, 4:24 PM  [place] [facility] PHYSICAL AND SPORTS MEDICINE [address], [place] Phone: [phone]   Fax:  [phone]  Name: Veronica Anthony MRN: Rosalyn Charters Date of Birth: [birth year]

## 2020-02-22 ENCOUNTER — Other Ambulatory Visit: Payer: Self-pay

## 2020-02-22 ENCOUNTER — Ambulatory Visit: Payer: BC Managed Care – PPO

## 2020-02-22 DIAGNOSIS — M6281 Muscle weakness (generalized): Secondary | ICD-10-CM

## 2020-02-22 DIAGNOSIS — M25511 Pain in right shoulder: Secondary | ICD-10-CM

## 2020-02-22 DIAGNOSIS — M5413 Radiculopathy, cervicothoracic region: Secondary | ICD-10-CM

## 2020-02-22 NOTE — Therapy (Signed)
[facility] [address] [place], [address] Phone: [phone]   Fax:  [phone]  Physical Therapy Treatment  Patient Details  Name: Veronica Anthony MRN: [id number] Date of Birth: 1961/02/17 Referring Provider (PT): Evon Slack, [place]   Encounter Date: 02/22/2020  PT End of Session - 02/22/20 1301    Visit Number  8    Number of Visits  13    Date for PT Re-Evaluation  03/02/20    PT Start Time  1301    PT Stop Time  1345    PT Time Calculation (min)  44 min    Activity Tolerance  Patient tolerated treatment well    Behavior During Therapy  [facility] for tasks assessed/performed       Past Medical History:  Diagnosis Date  . Complication of anesthesia    vomiting   . GERD (gastroesophageal reflux disease)   . History of kidney stones    2015  . Sleep apnea    2011     Past Surgical History:  Procedure Laterality Date  . OPEN REDUCTION INTERNAL FIXATION (ORIF) DISTAL RADIAL FRACTURE Right 11/04/2019   Procedure: OPEN REDUCTION INTERNAL FIXATION (ORIF) DISTAL RADIAL FRACTURE;  Surgeon: Kennedy Bucker, MD;  Location: [facility] ORS;  Service: Orthopedics;  Laterality: Right;  . TUBAL LIGATION  1991    There were no vitals filed for this visit.  Subjective Assessment - 02/22/20 1303    Subjective  Not much pain in R posterior shoulder at the moment. Mainly feels pain R anterior lateral arm and forearm 4-5/10 currently. No R shoulder pain at rest. 5/10 R arm and forearm pain.  Going back to work this Friday February 25, 2020)    Pertinent History  R shoulder pain. Pain in R shoulder began after her R hand surgery on 11/04/2019. Pt would feel pain in his R anterior lateral forearm, biceps area, and poisterior shoulder. Pt uses ice in her R upper arm helps relax the muscles. Used to feelt a muscle tension R anterior latarl arm which is becoming smaller and smaller. Feels pain when reaching towards the side and up.  Worst pain is when reaching up.  Bones hurt at night. R wrist fracture is almost completely healed. R UE pain has gotten a little better. However it continues to bother her at night when she sleeps on her R or L sides.    Patient Stated Goals  No pain, when sleepin either on her R or L sides.    Currently in Pain?  Yes    Pain Score  5     Pain Onset  More than a month ago                                PT Education - 02/22/20 1932    Education Details  ther-ex    Person(s) Educated  Patient    Methods  Explanation;Demonstration;Tactile cues;Verbal cues    Comprehension  Returned demonstration;Verbalized understanding      Objective   No pain with cooking, cleaning. However, when she stops doing her daily activities, she has pain.    Reproduction of R shoulder pain with palpation to R infraspinatus Reproduction of R UE pain with shoulder ER (infraspinatus muscle contraction palpated)  Pain located along R radial nerve distribution as well as at acromion process area  no pain with IR MMT  R forearm  pain decreasd with R radial nerve neural tension testing    R wrist fracture, mostly healed per pt  Interpreter: Madilyn Hook  Med[employer]    Manual therapy   Seated STM to R infraspinatus and teres major. Decreased R UE pain  Supine with R shoulder in slight abduction   Inferior, posterior, posterior lateral glide grade 3- to 3 to improve mobility   Supine with R shoulder in flexion   STM R teres major muscle  seated STM R rhomboid minor. Decreased R arm pain.    Therapeutic exercise  R shoulder isometrics, resistance away from fracture area.   IR 10x5 seconds. Increased R arm pain.   Supine R shoulder ER in scaption 10x3  Supine R shoulder flexion AAROM with PT 10x2  Then with PT manually stabilizing scapula 10x4    Improved exercise technique, movement at target joints, use of target muscles  after mod verbal, visual, tactile cues.   Response to treatment No R arm and forearm pain after session     Clinical impression Pt returns to clinic with R anterior lateral arm and forearm pain. Decreased pain with treatment to decrease muscle tension to lower cervical spine suggesting radicular involvement. Continued working on R glenohumeral joint mobility as well as decreasing soft tissue restrictions around the shoulder as well as ROM exercises to promote ability to raise her R arm up more comfortably. Pt tolerated session well without aggravation of symptoms. Pt will benefit from continued skilled physical therapy services to decrease pain, stiffness, improve ROM, strength and function.       PT Short Term Goals - 01/17/20 1717      PT SHORT TERM GOAL #1   Title  Pt will be independent with her HEP to decrease pain, improve AROM and ability to reach without pain.    Time  3    Period  Weeks    Status  New    Target Date  02/10/20        PT Long Term Goals - 01/17/20 1718      PT LONG TERM GOAL #1   Title  Patient will have a decrease in R shoulder and UE pain to 2/10 or less at worst to promote ability to raise her arm up as well as reach more comfortably.    Baseline  5/10 R shoulder pain at most for the past 2 months (01/17/2020)    Time  6    Period  Weeks    Status  New    Target Date  03/02/20      PT LONG TERM GOAL #2   Title  Patient will improve R shoulder strength by at least 1/2 MMT grade to promote ability to perform tasks with her R UE with less difficulty.    Time  6    Period  Weeks    Status  New    Target Date  03/02/20      PT LONG TERM GOAL #3   Title  Pt will improve her shoulder FOTO score by at least 10 points as a demonstration of improved function.    Baseline  shoulder FOTO: 38 (01/17/2020)    Time  6    Period  Weeks    Status  New    Target Date  03/02/20      PT LONG TERM GOAL #4   Title  Patient will improve R shoulder flexion and  abduction AROM by at least 15 degrees to promote ability to  reach with less difficulty.    Baseline  AROM R shoulder: flexion 133 degrees, abduction 77 degrees (01/17/2020)    Time  6    Period  Weeks    Status  New    Target Date  03/02/20            Plan - 02/22/20 1932    Clinical Impression Statement  Pt returns to clinic with R anterior lateral arm and forearm pain. Decreased pain with treatment to decrease muscle tension to lower cervical spine suggesting radicular involvement. Continued working on R glenohumeral joint mobility as well as decreasing soft tissue restrictions around the shoulder as well as ROM exercises to promote ability to raise her R arm up more comfortably. Pt tolerated session well without aggravation of symptoms. Pt will benefit from continued skilled physical therapy services to decrease pain, stiffness, improve ROM, strength and function.    Personal Factors and Comorbidities  Comorbidity 1;Time since onset of injury/illness/exacerbation;Profession    Comorbidities  R wrist fx    Examination-Activity Limitations  Bathing;Reach Overhead;Dressing;Hygiene/Grooming;Toileting;Lift;Carry    Stability/Clinical Decision Making  Stable/Uncomplicated    Rehab Potential  Fair    PT Frequency  2x / week    PT Duration  6 weeks    PT Treatment/Interventions  Therapeutic activities;Therapeutic exercise;Neuromuscular re-education;Patient/family education;Manual techniques;Dry needling;Aquatic Therapy;Electrical Stimulation;Iontophoresis 4mg /ml Dexamethasone;Ultrasound    PT Next Visit Plan  scapular strengthening, thoracic extension, glenohumeral control, neural flossing, manual techniques, modalities PRN    Consulted and Agree with Plan of Care  Patient       Patient will benefit from skilled therapeutic intervention in order to improve the following deficits and impairments:  Pain, Improper body mechanics, Decreased mobility, Increased muscle spasms, Postural dysfunction,  Decreased activity tolerance, Decreased endurance, Decreased range of motion, Decreased strength, Hypomobility, Difficulty walking  Visit Diagnosis: Muscle weakness (generalized)  Radiculopathy, cervicothoracic region  Acute pain of right shoulder     Problem List There are no problems to display for this patient.   PT, DPT  02/22/2020, 7:41 PM  [facility] PHYS[facility] SPORTS MEDICINE [address], [place] Phone: [phone]   Fax:  [phone]  Name: Kamia Insalaco MRN: Rosalyn Charters Date of Birth: 1961-05-30

## 2020-02-24 ENCOUNTER — Ambulatory Visit: Payer: BC Managed Care – PPO

## 2020-02-24 ENCOUNTER — Other Ambulatory Visit: Payer: Self-pay

## 2020-02-24 DIAGNOSIS — M25511 Pain in right shoulder: Secondary | ICD-10-CM

## 2020-02-24 DIAGNOSIS — M5413 Radiculopathy, cervicothoracic region: Secondary | ICD-10-CM

## 2020-02-24 DIAGNOSIS — M6281 Muscle weakness (generalized): Secondary | ICD-10-CM | POA: Diagnosis not present

## 2020-02-24 NOTE — Therapy (Signed)
[facility] [address] [place], [address] Phone: [phone]   Fax:  [phone]  Physical Therapy Treatment  Patient Details  Name: Veronica Anthony MRN: [id number] Date of Birth: [birth year] Referring Provider (PT): Evon Slack, [place]   Encounter Date: 02/24/2020   PT End of Session - 02/24/20 1504    Visit Number 9    Number of Visits 13    Date for PT Re-Evaluation 03/02/20    PT Start Time 1504    PT Stop Time 1550    PT Time Calculation (min) 46 min    Activity Tolerance Patient tolerated treatment well    Behavior During Therapy [facility] for tasks assessed/performed           Past Medical History:  Diagnosis Date  . Complication of anesthesia    vomiting   . GERD (gastroesophageal reflux disease)   . History of kidney stones    2015  . Sleep apnea    2011     Past Surgical History:  Procedure Laterality Date  . OPEN REDUCTION INTERNAL FIXATION (ORIF) DISTAL RADIAL FRACTURE Right 11/04/2019   Procedure: OPEN REDUCTION INTERNAL FIXATION (ORIF) DISTAL RADIAL FRACTURE;  Surgeon: Kennedy Bucker, MD;  Location: [facility] ORS;  Service: Orthopedics;  Laterality: Right;  . TUBAL LIGATION  1991    There were no vitals filed for this visit.   Subjective Assessment - 02/24/20 1506    Subjective The heating pad helps. 4/10 R shoulder joint at rest. 5/10 R anterior lateral arm and 4/10 R anterior lateral forarm pain currently. Easier to comb her hair.    Pertinent History R shoulder pain. Pain in R shoulder began after her R hand surgery on 11/04/2019. Pt would feel pain in his R anterior lateral forearm, biceps area, and poisterior shoulder. Pt uses ice in her R upper arm helps relax the muscles. Used to feelt a muscle tension R anterior latarl arm which is becoming smaller and smaller. Feels pain when reaching towards the side and up. Worst pain is when reaching up.  Bones hurt at night. R wrist fracture is  almost completely healed. R UE pain has gotten a little better. However it continues to bother her at night when she sleeps on her R or L sides.    Patient Stated Goals No pain, when sleepin either on her R or L sides.    Currently in Pain? Yes    Pain Score 5     Pain Onset More than a month ago                                     PT Education - 02/24/20 1652    Education Details ther-ex, HEP    Person(s) Educated Patient    Methods Explanation;Demonstration;Tactile cues;Verbal cues    Comprehension Returned demonstration;Verbalized understanding          Objective  No latex band allergies  No pain with cooking, cleaning. However, when she stops doing her daily activities, she has pain.    Reproduction of R shoulder pain with palpation to R infraspinatus Reproduction of R UE pain with shoulder ER (infraspinatus muscle contraction palpated)  Pain located along R radial nerve distribution as well as at acromion process area  no pain with IR MMT   R forearm pain decreasd with R radial nerve neural tension testing  R wrist fracture, mostly healed per pt  Interpreter: Nile Riggs  Medbridge Access Code [email or web address]     Manual therapy   seated STM R rhomboid minor. Decreased R arm pain.    Supine with R shoulder in flexion             Inferior, inferior lateral glide grade 3- to 3 to improve mobility   Supine with R shoulder in flexion              STM R teres major muscle   Therapeutic exercise  Standing R scapular retraction red band around arm 10x3 with 5 second holds   R shoulder flexion 130 degrees AROM R shoulder abduction 112 degrees AROM    Seated chin tucks 10x5 seconds for 2 sets  No R arm and forearm pain afterwards   Add S/L posterior capsule/sleeper stretch next visit if appropriate.   Reviewed HEP. Pt demonstrated and verbalized understanding. Handout provided.     Improved  exercise technique, movement at target joints, use of target muscles after mod verbal, visual, tactile cues.   Response to treatment No R arm and forearm pain after session      Clinical impression  Improved L shoulder abduction since initial evaluation. Continued working on decreasing inferior, and inferior lateral glenohumeral joint capsule stiffness as well as decreasing soft tissue restrictions to promote better intra articular mechanics when raising her arm. Decreased R UE symptoms with treatment to decrease stress to her lower cervical spine. Pt will benefit from continued skilled physical therapy to decrease pain, stiffness, improve AROM, strength and function.       PT Short Term Goals - 01/17/20 1717      PT SHORT TERM GOAL #1   Title Pt will be independent with her HEP to decrease pain, improve AROM and ability to reach without pain.    Time 3    Period Weeks    Status New    Target Date 02/10/20             PT Long Term Goals - 01/17/20 1718      PT LONG TERM GOAL #1   Title Patient will have a decrease in R shoulder and UE pain to 2/10 or less at worst to promote ability to raise her arm up as well as reach more comfortably.    Baseline 5/10 R shoulder pain at most for the past 2 months (01/17/2020)    Time 6    Period Weeks    Status New    Target Date 03/02/20      PT LONG TERM GOAL #2   Title Patient will improve R shoulder strength by at least 1/2 MMT grade to promote ability to perform tasks with her R UE with less difficulty.    Time 6    Period Weeks    Status New    Target Date 03/02/20      PT LONG TERM GOAL #3   Title Pt will improve her shoulder FOTO score by at least 10 points as a demonstration of improved function.    Baseline shoulder FOTO: 38 (01/17/2020)    Time 6    Period Weeks    Status New    Target Date 03/02/20      PT LONG TERM GOAL #4   Title Patient will improve R shoulder flexion and abduction AROM by at least 15 degrees  to promote ability to reach with less difficulty.    Baseline AROM R  shoulder: flexion 133 degrees, abduction 77 degrees (01/17/2020)    Time 6    Period Weeks    Status New    Target Date 03/02/20                 Plan - 02/24/20 1658    Clinical Impression Statement Improved L shoulder abduction since initial evaluation. Continued working on decreasing inferior, and inferior lateral glenohumeral joint capsule stiffness as well as decreasing soft tissue restrictions to promote better intra articular mechanics when raising her arm. Decreased R UE symptoms with treatment to decrease stress to her lower cervical spine. Pt will benefit from continued skilled physical therapy to decrease pain, stiffness, improve AROM, strength and function.    Personal Factors and Comorbidities Comorbidity 1;Time since onset of injury/illness/exacerbation;Profession    Comorbidities R wrist fx    Examination-Activity Limitations Bathing;Reach Overhead;Dressing;Hygiene/Grooming;Toileting;Lift;Carry    Stability/Clinical Decision Making Stable/Uncomplicated    Rehab Potential Fair    PT Frequency 2x / week    PT Duration 6 weeks    PT Treatment/Interventions Therapeutic activities;Therapeutic exercise;Neuromuscular re-education;Patient/family education;Manual techniques;Dry needling;Aquatic Therapy;Electrical Stimulation;Iontophoresis 4mg /ml Dexamethasone;Ultrasound    PT Next Visit Plan scapular strengthening, thoracic extension, glenohumeral control, neural flossing, manual techniques, modalities PRN    Consulted and Agree with Plan of Care Patient           Patient will benefit from skilled therapeutic intervention in order to improve the following deficits and impairments:  Pain, Improper body mechanics, Decreased mobility, Increased muscle spasms, Postural dysfunction, Decreased activity tolerance, Decreased endurance, Decreased range of motion, Decreased strength, Hypomobility, Difficulty  walking  Visit Diagnosis: Muscle weakness (generalized)  Radiculopathy, cervicothoracic region  Acute pain of right shoulder     Problem List There are no problems to display for this patient.   Joneen Boers PT, DPT   02/24/2020, 5:09 PM  [place] [facility] [address], [place], [address] Phone: [phone]   Fax:  [phone]  Name: Veronica Anthony MRN: [id number] Date of Birth: Feb 12, 1961

## 2020-02-24 NOTE — Patient Instructions (Signed)
Access Code: [email or web address] URL: [email or web address] Date: 02/24/2020 Prepared by: Loralyn Freshwater  Exercises Scapular Retraction with Resistance - 1 x daily - 7 x weekly - 3 sets - 10 reps - 5 seconds hold

## 2020-02-28 ENCOUNTER — Other Ambulatory Visit: Payer: Self-pay

## 2020-02-28 ENCOUNTER — Ambulatory Visit: Payer: BC Managed Care – PPO

## 2020-02-28 DIAGNOSIS — M25511 Pain in right shoulder: Secondary | ICD-10-CM

## 2020-02-28 DIAGNOSIS — M5413 Radiculopathy, cervicothoracic region: Secondary | ICD-10-CM

## 2020-02-28 DIAGNOSIS — M6281 Muscle weakness (generalized): Secondary | ICD-10-CM | POA: Diagnosis not present

## 2020-02-28 NOTE — Therapy (Signed)
[place] [facility] [address] [place], [address] Phone: [phone]   Fax:  [phone]  Physical Therapy Treatment  Patient Details  Name: Mirtie Bastyr MRN: [id number] Date of Birth: 1960-09-20 Referring Provider (PT): Duanne Guess, [place]   Encounter Date: 02/28/2020   PT End of Session - 02/28/20 1726    Visit Number 10    Number of Visits 19    Date for PT Re-Evaluation 03/16/20    PT Start Time 1726    PT Stop Time 1813    PT Time Calculation (min) 47 min    Activity Tolerance Patient tolerated treatment well    Behavior During Therapy [facility] for tasks assessed/performed           Past Medical History:  Diagnosis Date  . Complication of anesthesia    vomiting   . GERD (gastroesophageal reflux disease)   . History of kidney stones    2015  . Sleep apnea    2011     Past Surgical History:  Procedure Laterality Date  . OPEN REDUCTION INTERNAL FIXATION (ORIF) DISTAL RADIAL FRACTURE Right 11/04/2019   Procedure: OPEN REDUCTION INTERNAL FIXATION (ORIF) DISTAL RADIAL FRACTURE;  Surgeon: Hessie Knows, MD;  Location: [facility] ORS;  Service: Orthopedics;  Laterality: Right;  . TUBAL LIGATION  1991    There were no vitals filed for this visit.   Subjective Assessment - 02/28/20 1727    Subjective First day of work, R shoulder was fine. Only had pain that night at her R lateral arm. Did not disappear. Only did the band exercise once because it was very hot at home because her [facility] went out.  R shoulder and arm was better today at work. Has not hurt today. Was able to move her arm today overhead and it was fine and was happy with it. 0/10 currently.    Pertinent History R shoulder pain. Pain in R shoulder began after her R hand surgery on 11/04/2019. Pt would feel pain in his R anterior lateral forearm, biceps area, and poisterior shoulder. Pt uses ice in her R upper arm helps relax the muscles. Used to feelt a  muscle tension R anterior latarl arm which is becoming smaller and smaller. Feels pain when reaching towards the side and up. Worst pain is when reaching up.  Bones hurt at night. R wrist fracture is almost completely healed. R UE pain has gotten a little better. However it continues to bother her at night when she sleeps on her R or L sides.    Patient Stated Goals No pain, when sleepin either on her R or L sides.    Currently in Pain? No/denies    Pain Score 0-No pain    Pain Onset More than a month ago              [facility] PT Assessment - 02/28/20 1735      Strength   Right Shoulder Flexion 4+/5   no pain   Right Shoulder ABduction 4+/5   no pain   Right Shoulder Internal Rotation 4+/5   no pain   Right Shoulder External Rotation 4+/5   no pain.                                 PT Education - 02/28/20 1802    Education Details ther-ex, HEP    Person(s) Educated Patient  Methods Explanation;Demonstration;Tactile cues;Verbal cues;Handout    Comprehension Returned demonstration;Verbalized understanding            Objective  No latex band allergies  No pain with cooking, cleaning. However, when she stops doing her daily activities, she has pain.    Reproduction of R shoulder pain with palpation to R infraspinatus Reproduction of R UE pain with shoulder ER (infraspinatus muscle contraction palpated)  Pain located along R radial nerve distribution as well as at acromion process area  no pain with IR MMT   R forearm pain decreasd with R radial nerve neural tension testing    R wrist fracture, mostly healed per pt  Interpreter: Dorthula Rue  Medbridge Access Code [email or web address]      Manual therapy   Supine with R shoulder in flexion Inferior, inferior lateral glide grade to 3 to improve mobility    Supine with R shoulder in flexion  STM R teres major muscle   Seated STM R  infraspinatus to decrease tension  No noticeable difference with functional IR.      Therapeutic exercise  Standing R shoulder AROM  Flexion: 130 degrees, abduction 131 degrees  Manually resisted R shoulder flexion, abduction, ER, IR  No pain with all planes 4+/5  Reviewed progress/current status with strength with pt.    R shoulder sleeper stretch 6x5 seconds. Anterior shoulder discomfort  Supine R shoulder flexion after manual therapy 136 degrees  Supine R shoulder flexion 1 lbs 10x5 seconds  145 degrees supine R shoulder flexion    Standing R shoulder flexion AROM 135 degrees after aforementioned treatment.        Improved exercise technique, movement at target joints, use of target muscles after mod verbal, visual, tactile cues.   Response to treatment Pt tolerated session well without aggravation of symptoms.     Clinical impression Pt demonstrates overall decreased R shoulder pain, improved R shoulder abduction AROM, as well as improved shoulder abduction and ER strength (and no complain of pain with manual muscle testing R shoulder all planes), and improved FOTO score suggesting improved function since initial evaluation. Pt making very good progress with PT towards goals. Pt still demonstrates limited R shoulder AROM, pain, and difficulty performing tasks which involve reaching and would benefit from continued skilled physical therapy services to address the aforementioned deficits.      PT Short Term Goals - 02/28/20 1730      PT SHORT TERM GOAL #1   Title Pt will be independent with her HEP to decrease pain, improve AROM and ability to reach without pain.    Baseline Patient tries, had to stop due to [facility] went out and was too hot at home (02/28/2020)    Time 3    Period Weeks    Status On-going    Target Date 02/10/20             PT Long Term Goals - 02/28/20 1731      PT LONG TERM GOAL #1   Title Patient will have a decrease in R  shoulder and UE pain to 2/10 or less at worst to promote ability to raise her arm up as well as reach more comfortably.    Baseline 5/10 R shoulder pain at most for the past 2 months (01/17/2020); 3/10 at worst for the past 7 days (02/28/2020)    Time 3    Period Weeks    Status Partially Met    Target Date 03/16/20  PT LONG TERM GOAL #2   Title Patient will improve R shoulder strength by at least 1/2 MMT grade to promote ability to perform tasks with her R UE with less difficulty.    Time 3    Period Weeks    Status Partially Met    Target Date 03/16/20      PT LONG TERM GOAL #3   Title Pt will improve her shoulder FOTO score by at least 10 points as a demonstration of improved function.    Baseline shoulder FOTO: 38 (01/17/2020); 62 (02/28/2020)    Time 6    Period Weeks    Status Achieved    Target Date 03/02/20      PT LONG TERM GOAL #4   Title Patient will improve R shoulder flexion and abduction AROM by at least 15 degrees to promote ability to reach with less difficulty.    Baseline AROM R shoulder: flexion 133 degrees, abduction 77 degrees (01/17/2020); Flexion: 130 degrees, abduction 131 degrees (02/28/2020)    Time 3    Period Weeks    Status Partially Met    Target Date 03/16/20                 Plan - 02/28/20 1820    Clinical Impression Statement Pt demonstrates overall decreased R shoulder pain, improved R shoulder abduction AROM, as well as improved shoulder abduction and ER strength (and no complain of pain with manual muscle testing R shoulder all planes), and improved FOTO score suggesting improved function since initial evaluation. Pt making very good progress with PT towards goals. Pt still demonstrates limited R shoulder AROM, pain, and difficulty performing tasks which involve reaching and would benefit from continued skilled physical therapy services to address the aforementioned deficits.    Personal Factors and Comorbidities Comorbidity 1;Time since onset  of injury/illness/exacerbation;Profession    Comorbidities R wrist fx    Examination-Activity Limitations Bathing;Reach Overhead;Dressing;Hygiene/Grooming;Toileting;Lift;Carry    Stability/Clinical Decision Making Stable/Uncomplicated    Clinical Decision Making Low    Rehab Potential Fair    PT Frequency 2x / week    PT Duration 3 weeks    PT Treatment/Interventions Therapeutic activities;Therapeutic exercise;Neuromuscular re-education;Patient/family education;Manual techniques;Dry needling;Aquatic Therapy;Electrical Stimulation;Iontophoresis 88m/ml Dexamethasone;Ultrasound    PT Next Visit Plan scapular strengthening, thoracic extension, glenohumeral control, neural flossing, manual techniques, modalities PRN    Consulted and Agree with Plan of Care Patient           Patient will benefit from skilled therapeutic intervention in order to improve the following deficits and impairments:  Pain, Improper body mechanics, Decreased mobility, Increased muscle spasms, Postural dysfunction, Decreased activity tolerance, Decreased endurance, Decreased range of motion, Decreased strength, Hypomobility, Difficulty walking  Visit Diagnosis: Acute pain of right shoulder - Plan: PT plan of care cert/re-cert  Muscle weakness (generalized) - Plan: PT plan of care cert/re-cert  Radiculopathy, cervicothoracic region - Plan: PT plan of care cert/re-cert     Problem List There are no problems to display for this patient.   MJoneen BoersPT, DPT   02/28/2020, 6:38 PM  [place] [facility] [address] [place] [address]Phone: [phone]  Fax:  [phone] Name: EEnna WarwickMRN: [id number] of Birth: 203/08/1961

## 2020-02-28 NOTE — Patient Instructions (Signed)
Access Code: [email or web address] URL: [email or web address] Date: 02/28/2020 Prepared by: Loralyn Freshwater  Exercises Scapular Retraction with Resistance - 1 x daily - 7 x weekly - 3 sets - 10 reps - 5 seconds hold Seated Cervical Retraction - 1 x daily - 7 x weekly - 3 sets - 10 reps - 5 seconds hold Supine Shoulder Flexion Extension Full Range AROM - 2 x daily - 7 x weekly - 2 sets - 10 reps - 5 seconds hold

## 2020-03-02 ENCOUNTER — Other Ambulatory Visit: Payer: Self-pay

## 2020-03-02 ENCOUNTER — Ambulatory Visit: Payer: BC Managed Care – PPO

## 2020-03-02 DIAGNOSIS — M6281 Muscle weakness (generalized): Secondary | ICD-10-CM

## 2020-03-02 DIAGNOSIS — M25511 Pain in right shoulder: Secondary | ICD-10-CM

## 2020-03-02 DIAGNOSIS — M5413 Radiculopathy, cervicothoracic region: Secondary | ICD-10-CM

## 2020-03-02 NOTE — Therapy (Signed)
[place] [facility] [address], [place], [address] Phone: [phone]   Fax:  [phone]  Physical Therapy Treatment  Patient Details  Name: Veronica Anthony MRN: [id number] Date of Birth: 03-Jul-1961 Referring Provider (PT): Duanne Guess, [place]   Encounter Date: 03/02/2020   PT End of Session - 03/02/20 1650    Visit Number 11    Number of Visits 19    Date for PT Re-Evaluation 03/16/20    PT Start Time 5929    PT Stop Time 1732    PT Time Calculation (min) 42 min    Activity Tolerance Patient tolerated treatment well    Behavior During Therapy [facility] for tasks assessed/performed           Past Medical History:  Diagnosis Date  . Complication of anesthesia    vomiting   . GERD (gastroesophageal reflux disease)   . History of kidney stones    2015  . Sleep apnea    2011     Past Surgical History:  Procedure Laterality Date  . OPEN REDUCTION INTERNAL FIXATION (ORIF) DISTAL RADIAL FRACTURE Right 11/04/2019   Procedure: OPEN REDUCTION INTERNAL FIXATION (ORIF) DISTAL RADIAL FRACTURE;  Surgeon: Hessie Knows, MD;  Location: [facility] ORS;  Service: Orthopedics;  Laterality: Right;  . TUBAL LIGATION  1991    There were no vitals filed for this visit.   Subjective Assessment - 03/02/20 1651    Subjective R shoulder does not bother her currently. Hurt a little bit yesterday at night. R shoulder doing ok at work. Pt tired. Worked today.    Pertinent History R shoulder pain. Pain in R shoulder began after her R hand surgery on 11/04/2019. Pt would feel pain in his R anterior lateral forearm, biceps area, and poisterior shoulder. Pt uses ice in her R upper arm helps relax the muscles. Used to feelt a muscle tension R anterior latarl arm which is becoming smaller and smaller. Feels pain when reaching towards the side and up. Worst pain is when reaching up.  Bones hurt at night. R wrist fracture is almost completely healed.  R UE pain has gotten a little better. However it continues to bother her at night when she sleeps on her R or L sides.    Patient Stated Goals No pain, when sleepin either on her R or L sides.    Currently in Pain? No/denies    Pain Score 0-No pain    Pain Onset More than a month ago                                     PT Education - 03/02/20 1835    Education Details ther-ex    Person(s) Educated Patient    Methods Explanation;Demonstration;Tactile cues;Verbal cues    Comprehension Returned demonstration;Verbalized understanding          Objective  No latex band allergies  No pain with cooking, cleaning. However, when she stops doing her daily activities, she has pain.         R wrist fracture, mostly healed per pt  Interpreter:none. Pt verbalized that it is fine that there is no interpreter today.   MedbridgeAccess Code [email or web address]     Manual therapy    Supine with R shoulder in flexion Inferior,inferiorlateral glide grade 3- to 3 to improve mobility   Supine with R shoulder in flexion  STM R teres major muscle   seated STM R infraspinatus  Slight improved R shoulder functional IR observed    Therapeutic exercise   Supine R shoulder flexion 1 lbs 5x 5 seconds   Supine R shoulder flexion AAROM with PT 10x3 with 5 second holds with PT stabilizing scapula with thenar to stretch teres major muscle as well   135 degrees R shoulder flexion AROM afterwards    Standing R shoulder extension red band around arm 10x3  Seated manually resisted scapular retraction 10x5 seconds       Improved exercise technique, movement at target joints, use of target muscles after mod verbal, visual, tactile cues.   Response to treatment Able to raise R arm up more comfortrably after session.    Clinical impression Continued working in improving R glenohumeral joint mobility,  decreasing soft tissue restrictions and improving scapular strength to promote ability to raise her arm up and reach behind her back more comfortably. Improved ability to reach behind her back and raise her arm up more comfortably after session. Pt will benefit from continued skilled physical therapy services to decrease pain, improve ROM, strength and function.       PT Short Term Goals - 02/28/20 1730      PT SHORT TERM GOAL #1   Title Pt will be independent with her HEP to decrease pain, improve AROM and ability to reach without pain.    Baseline Patient tries, had to stop due to [facility] went out and was too hot at home (02/28/2020)    Time 3    Period Weeks    Status On-going    Target Date 02/10/20             PT Long Term Goals - 02/28/20 1731      PT LONG TERM GOAL #1   Title Patient will have a decrease in R shoulder and UE pain to 2/10 or less at worst to promote ability to raise her arm up as well as reach more comfortably.    Baseline 5/10 R shoulder pain at most for the past 2 months (01/17/2020); 3/10 at worst for the past 7 days (02/28/2020)    Time 3    Period Weeks    Status Partially Met    Target Date 03/16/20      PT LONG TERM GOAL #2   Title Patient will improve R shoulder strength by at least 1/2 MMT grade to promote ability to perform tasks with her R UE with less difficulty.    Time 3    Period Weeks    Status Partially Met    Target Date 03/16/20      PT LONG TERM GOAL #3   Title Pt will improve her shoulder FOTO score by at least 10 points as a demonstration of improved function.    Baseline shoulder FOTO: 38 (01/17/2020); 62 (02/28/2020)    Time 6    Period Weeks    Status Achieved    Target Date 03/02/20      PT LONG TERM GOAL #4   Title Patient will improve R shoulder flexion and abduction AROM by at least 15 degrees to promote ability to reach with less difficulty.    Baseline AROM R shoulder: flexion 133 degrees, abduction 77 degrees (01/17/2020);  Flexion: 130 degrees, abduction 131 degrees (02/28/2020)    Time 3    Period Weeks    Status Partially Met    Target Date 03/16/20  Plan - 03/02/20 1835    Clinical Impression Statement Continued working in improving R glenohumeral joint mobility, decreasing soft tissue restrictions and improving scapular strength to promote ability to raise her arm up and reach behind her back more comfortably. Improved ability to reach behind her back and raise her arm up more comfortably after session. Pt will benefit from continued skilled physical therapy services to decrease pain, improve ROM, strength and function.    Personal Factors and Comorbidities Comorbidity 1;Time since onset of injury/illness/exacerbation;Profession    Comorbidities R wrist fx    Examination-Activity Limitations Bathing;Reach Overhead;Dressing;Hygiene/Grooming;Toileting;Lift;Carry    Stability/Clinical Decision Making Stable/Uncomplicated    Rehab Potential Fair    PT Frequency 2x / week    PT Duration 3 weeks    PT Treatment/Interventions Therapeutic activities;Therapeutic exercise;Neuromuscular re-education;Patient/family education;Manual techniques;Dry needling;Aquatic Therapy;Electrical Stimulation;Iontophoresis 58m/ml Dexamethasone;Ultrasound    PT Next Visit Plan scapular strengthening, thoracic extension, glenohumeral control, neural flossing, manual techniques, modalities PRN    Consulted and Agree with Plan of Care Patient           Patient will benefit from skilled therapeutic intervention in order to improve the following deficits and impairments:  Pain, Improper body mechanics, Decreased mobility, Increased muscle spasms, Postural dysfunction, Decreased activity tolerance, Decreased endurance, Decreased range of motion, Decreased strength, Hypomobility, Difficulty walking  Visit Diagnosis: Muscle weakness (generalized)  Radiculopathy, cervicothoracic region  Acute pain of right  shoulder     Problem List There are no problems to display for this patient.  MJoneen BoersPT, DPT   03/02/2020, 6:41 PM  [facility] [address] [place] [address]Phone: [phone]  Fax:  [phone] Name: EJeylin WoodmanseeMRN: [id number] of Birth: 2Nov 11, 1962

## 2020-03-06 ENCOUNTER — Ambulatory Visit: Payer: BC Managed Care – PPO

## 2020-03-06 ENCOUNTER — Other Ambulatory Visit: Payer: Self-pay

## 2020-03-06 DIAGNOSIS — M25511 Pain in right shoulder: Secondary | ICD-10-CM

## 2020-03-06 DIAGNOSIS — M6281 Muscle weakness (generalized): Secondary | ICD-10-CM

## 2020-03-06 DIAGNOSIS — M5413 Radiculopathy, cervicothoracic region: Secondary | ICD-10-CM

## 2020-03-06 NOTE — Therapy (Signed)
[facility] [address] [place], [address] Phone: [phone]   Fax:  [phone]  Physical Therapy Treatment  Patient Details  Name: Veronica Anthony MRN: [id number] Date of Birth: [birth year] Referring Provider (PT): Duanne Guess, [place]   Encounter Date: 03/06/2020   PT End of Session - 03/06/20 1603    Visit Number 12    Number of Visits 19    Date for PT Re-Evaluation 03/16/20    PT Start Time 1603    PT Stop Time 1644    PT Time Calculation (min) 41 min    Activity Tolerance Patient tolerated treatment well    Behavior During Therapy [facility] for tasks assessed/performed           Past Medical History:  Diagnosis Date  . Complication of anesthesia    vomiting   . GERD (gastroesophageal reflux disease)   . History of kidney stones    2015  . Sleep apnea    2011     Past Surgical History:  Procedure Laterality Date  . OPEN REDUCTION INTERNAL FIXATION (ORIF) DISTAL RADIAL FRACTURE Right 11/04/2019   Procedure: OPEN REDUCTION INTERNAL FIXATION (ORIF) DISTAL RADIAL FRACTURE;  Surgeon: Hessie Knows, MD;  Location: [facility] ORS;  Service: Orthopedics;  Laterality: Right;  . TUBAL LIGATION  1991    There were no vitals filed for this visit.   Subjective Assessment - 03/06/20 1605    Subjective R shoulder is doing good. No pain currently. R shoulder is good at work. 2-3/10 R shoulder pain at most for the past 7 days. Has been doing her exercises at home.    Pertinent History R shoulder pain. Pain in R shoulder began after her R hand surgery on 11/04/2019. Pt would feel pain in his R anterior lateral forearm, biceps area, and poisterior shoulder. Pt uses ice in her R upper arm helps relax the muscles. Used to feelt a muscle tension R anterior latarl arm which is becoming smaller and smaller. Feels pain when reaching towards the side and up. Worst pain is when reaching up.  Bones hurt at night. R wrist  fracture is almost completely healed. R UE pain has gotten a little better. However it continues to bother her at night when she sleeps on her R or L sides.    Patient Stated Goals No pain, when sleepin either on her R or L sides.    Currently in Pain? No/denies    Pain Onset More than a month ago                                     PT Education - 03/06/20 1927    Education Details ther-ex    Person(s) Educated Patient    Methods Explanation;Demonstration;Tactile cues;Verbal cues    Comprehension Returned demonstration;Verbalized understanding          Objective  No latex band allergies  No pain with cooking, cleaning. However, when she stops doing her daily activities, she has pain.         R wrist fracture, mostly healed per pt  Interpreter:Eddie Sobalvaro   MedbridgeAccess Code [email or web address]    Manual therapy    Supine with R shoulder in flexion Inferior,inferiorlateral, posterior glide grade 3- to 3 to improve mobility   Supine with R shoulder in flexion  STM R teres major muscle  140 degrees R shoulder  flexion AROM afterwards  seated STM R infraspinatus             Slight improved R shoulder functional IR observed  seated inferior glide R shoulder in neutral     Slight improved R shoulder functional IR afterwards   Therapeutic exercise  R shoulder AROM   Flexion: 130 degrees with posterior R shoulder pain   Abduction: 115 degrees with pain  Functional IR: R hand to R posterior hip  R shoulder AROM multiple times to assess effectiveness of treatment  Flexion, aduction, functional IR  R shoulder cross arm/adduction stretch with PT 10x10 seconds       Improved exercise technique, movement at target joints, use of target muscles after mod verbal, visual, tactile cues.   Response to treatment Pt tolerated session well without aggravation of symptoms.     Clinical impression Improved R shoulder flexion AROM after treatment to promote shoulder joint mobility and decrease teres major muscle tension. Slight improved R shoulder functional IR after treatment to promote inferior glenohumeral joint glide and decreasing infraspinatus muscle tension. Pt will benefit from continued skilled physical therapy services to decrease pain, improve ROM, strength and function.    PT Short Term Goals - 02/28/20 1730      PT SHORT TERM GOAL #1   Title Pt will be independent with her HEP to decrease pain, improve AROM and ability to reach without pain.    Baseline Patient tries, had to stop due to [facility] went out and was too hot at home (02/28/2020)    Time 3    Period Weeks    Status On-going    Target Date 02/10/20             PT Long Term Goals - 02/28/20 1731      PT LONG TERM GOAL #1   Title Patient will have a decrease in R shoulder and UE pain to 2/10 or less at worst to promote ability to raise her arm up as well as reach more comfortably.    Baseline 5/10 R shoulder pain at most for the past 2 months (01/17/2020); 3/10 at worst for the past 7 days (02/28/2020)    Time 3    Period Weeks    Status Partially Met    Target Date 03/16/20      PT LONG TERM GOAL #2   Title Patient will improve R shoulder strength by at least 1/2 MMT grade to promote ability to perform tasks with her R UE with less difficulty.    Time 3    Period Weeks    Status Partially Met    Target Date 03/16/20      PT LONG TERM GOAL #3   Title Pt will improve her shoulder FOTO score by at least 10 points as a demonstration of improved function.    Baseline shoulder FOTO: 38 (01/17/2020); 62 (02/28/2020)    Time 6    Period Weeks    Status Achieved    Target Date 03/02/20      PT LONG TERM GOAL #4   Title Patient will improve R shoulder flexion and abduction AROM by at least 15 degrees to promote ability to reach with less difficulty.    Baseline AROM R shoulder: flexion  133 degrees, abduction 77 degrees (01/17/2020); Flexion: 130 degrees, abduction 131 degrees (02/28/2020)    Time 3    Period Weeks    Status Partially Met    Target Date 03/16/20  Plan - 03/06/20 1927    Clinical Impression Statement Improved R shoulder flexion AROM after treatment to promote shoulder joint mobility and decrease teres major muscle tension. Slight improved R shoulder functional IR after treatment to promote inferior glenohumeral joint glide and decreasing infraspinatus muscle tension. Pt will benefit from continued skilled physical therapy services to decrease pain, improve ROM, strength and function,.    Personal Factors and Comorbidities Comorbidity 1;Time since onset of injury/illness/exacerbation;Profession    Comorbidities R wrist fx    Examination-Activity Limitations Bathing;Reach Overhead;Dressing;Hygiene/Grooming;Toileting;Lift;Carry    Stability/Clinical Decision Making Stable/Uncomplicated    Rehab Potential Fair    PT Frequency 2x / week    PT Duration 3 weeks    PT Treatment/Interventions Therapeutic activities;Therapeutic exercise;Neuromuscular re-education;Patient/family education;Manual techniques;Dry needling;Aquatic Therapy;Electrical Stimulation;Iontophoresis 10m/ml Dexamethasone;Ultrasound    PT Next Visit Plan scapular strengthening, thoracic extension, glenohumeral control, neural flossing, manual techniques, modalities PRN    Consulted and Agree with Plan of Care Patient           Patient will benefit from skilled therapeutic intervention in order to improve the following deficits and impairments:  Pain, Improper body mechanics, Decreased mobility, Increased muscle spasms, Postural dysfunction, Decreased activity tolerance, Decreased endurance, Decreased range of motion, Decreased strength, Hypomobility, Difficulty walking  Visit Diagnosis: Muscle weakness (generalized)  Radiculopathy, cervicothoracic region  Acute pain of  right shoulder     Problem List There are no problems to display for this patient.   MJoneen BoersPT, DPT   03/06/2020, 7:32 PM  [facility] [address] [place] [address]Phone: [phone]  Fax:  [phone] Name: Veronica LarisMRN: [id number] of Birth: 211-Dec-1962

## 2020-03-08 ENCOUNTER — Ambulatory Visit: Payer: BC Managed Care – PPO

## 2020-03-08 ENCOUNTER — Other Ambulatory Visit: Payer: Self-pay

## 2020-03-08 DIAGNOSIS — M6281 Muscle weakness (generalized): Secondary | ICD-10-CM

## 2020-03-08 DIAGNOSIS — M25511 Pain in right shoulder: Secondary | ICD-10-CM

## 2020-03-08 DIAGNOSIS — M5413 Radiculopathy, cervicothoracic region: Secondary | ICD-10-CM

## 2020-03-08 NOTE — Patient Instructions (Signed)
Access Code: [email or web address] URL: [email or web address] Date: 03/08/2020 Prepared by: Loralyn Freshwater  Exercises Scapular Retraction with Resistance - 1 x daily - 7 x weekly - 3 sets - 10 reps - 5 seconds hold Seated Cervical Retraction - 1 x daily - 7 x weekly - 3 sets - 10 reps - 5 seconds hold Supine Shoulder Flexion Extension Full Range AROM - 2 x daily - 7 x weekly - 2 sets - 10 reps - 5 seconds hold Shoulder External Rotation with Anchored Resistance - 1 x daily - 7 x weekly - 3 sets - 10 reps

## 2020-03-08 NOTE — Therapy (Signed)
[place] [facility] [address], [place], [address] Phone: [phone]   Fax:  [phone]  Physical Therapy Treatment  Patient Details  Name: Veronica Anthony MRN: [id number] Date of Birth: 1961/03/10 Referring Provider (PT): Duanne Guess, [place]   Encounter Date: 03/08/2020   PT End of Session - 03/08/20 1735    Visit Number 13    Number of Visits 19    Date for PT Re-Evaluation 03/16/20    PT Start Time 1791    PT Stop Time 1817    PT Time Calculation (min) 42 min    Activity Tolerance Patient tolerated treatment well    Behavior During Therapy [facility] for tasks assessed/performed           Past Medical History:  Diagnosis Date  . Complication of anesthesia    vomiting   . GERD (gastroesophageal reflux disease)   . History of kidney stones    2015  . Sleep apnea    2011     Past Surgical History:  Procedure Laterality Date  . OPEN REDUCTION INTERNAL FIXATION (ORIF) DISTAL RADIAL FRACTURE Right 11/04/2019   Procedure: OPEN REDUCTION INTERNAL FIXATION (ORIF) DISTAL RADIAL FRACTURE;  Surgeon: Hessie Knows, MD;  Location: [facility] ORS;  Service: Orthopedics;  Laterality: Right;  . TUBAL LIGATION  1991    There were no vitals filed for this visit.   Subjective Assessment - 03/08/20 1736    Subjective No R shoulder pain currently. R UE radiating symptoms are much better.    Pertinent History R shoulder pain. Pain in R shoulder began after her R hand surgery on 11/04/2019. Pt would feel pain in his R anterior lateral forearm, biceps area, and poisterior shoulder. Pt uses ice in her R upper arm helps relax the muscles. Used to feelt a muscle tension R anterior latarl arm which is becoming smaller and smaller. Feels pain when reaching towards the side and up. Worst pain is when reaching up.  Bones hurt at night. R wrist fracture is almost completely healed. R UE pain has gotten a little better. However it continues to  bother her at night when she sleeps on her R or L sides.    Patient Stated Goals No pain, when sleepin either on her R or L sides.    Currently in Pain? No/denies   no pain at rest   Pain Onset More than a month ago                                     PT Education - 03/08/20 1825    Education Details ther-ex, HEP    Person(s) Educated Patient    Methods Explanation;Demonstration;Tactile cues;Verbal cues;Handout    Comprehension Returned demonstration;Verbalized understanding          Objective  No latex band allergies  No pain with cooking, cleaning. However, when she stops doing her daily activities, she has pain.         R wrist fracture, mostly healed per pt  Interpreter:Eddie Sobalvaro   MedbridgeAccess Code [email or web address]    Manual therapy   Supine with R shoulder in flexion Inferior,inferiorlateral, posterior glide grade3-to 3 to improve mobility   Supine with R shoulder in flexion  STM R teres major musce  Improved shoulder flexion AROM.    seated STM R infraspinatus Slight improved R shoulder functional IR observed  Therapeutic exercise   With 5 lbs around distal arm  Functional IR R shoulder 10x3  Improved ROM reaching behind back (pt happy per subjective report)  R shoulder pull when weight was there for longer than 10x3  Try 3 lbs next time.    R shoulder ER yellow band around proximal forearm 10x2  R shoulder IR yellow band around proximal forearm 10x5 seconds for 2 sets  Reviewed HEP. Pt demonstrated and verbalized understanding. Handout provided.   R shoulder flexion and abduction multiple times to assess effectiveness of treatment.    Improved exercise technique, movement at target joints, use of target muscles after mod verbal, visual, tactile cues.   Response to treatment Pt tolerated session well without aggravation of  symptoms.    Clinical impression Improved R shoulder functional IR with treatment to promote inferior glenohumeral joint mobility as well as decreasing infraspinatus muscle tension. Improved R shoulder flexion AROM after treatment to promote inferior and inferior lateral glenohumeral joint mobility and decreasing teres major muscle tension. Worked on [employer] and ER strengthening as well to promote proper intraarticular mechanics at her R shoulder. Pt tolerated session well without aggravation of symptoms. Pt will benefit from continued skilled physical therapy services to improve joint mobility, strength, AROM, and function.      PT Short Term Goals - 02/28/20 1730      PT SHORT TERM GOAL #1   Title Pt will be independent with her HEP to decrease pain, improve AROM and ability to reach without pain.    Baseline Patient tries, had to stop due to [facility] went out and was too hot at home (02/28/2020)    Time 3    Period Weeks    Status On-going    Target Date 02/10/20             PT Long Term Goals - 02/28/20 1731      PT LONG TERM GOAL #1   Title Patient will have a decrease in R shoulder and UE pain to 2/10 or less at worst to promote ability to raise her arm up as well as reach more comfortably.    Baseline 5/10 R shoulder pain at most for the past 2 months (01/17/2020); 3/10 at worst for the past 7 days (02/28/2020)    Time 3    Period Weeks    Status Partially Met    Target Date 03/16/20      PT LONG TERM GOAL #2   Title Patient will improve R shoulder strength by at least 1/2 MMT grade to promote ability to perform tasks with her R UE with less difficulty.    Time 3    Period Weeks    Status Partially Met    Target Date 03/16/20      PT LONG TERM GOAL #3   Title Pt will improve her shoulder FOTO score by at least 10 points as a demonstration of improved function.    Baseline shoulder FOTO: 38 (01/17/2020); 62 (02/28/2020)    Time 6    Period Weeks    Status Achieved    Target Date  03/02/20      PT LONG TERM GOAL #4   Title Patient will improve R shoulder flexion and abduction AROM by at least 15 degrees to promote ability to reach with less difficulty.    Baseline AROM R shoulder: flexion 133 degrees, abduction 77 degrees (01/17/2020); Flexion: 130 degrees, abduction 131 degrees (02/28/2020)    Time 3  Period Weeks    Status Partially Met    Target Date 03/16/20                 Plan - 03/08/20 1821    Clinical Impression Statement Improved R shoulder functional IR with treatment to promote inferior glenohumeral joint mobility as well as decreasing infraspinatus muscle tension. Improved R shoulder flexion AROM after treatment to promote inferior and inferior lateral glenohumeral joint mobility and decreasing teres major muscle tension. Worked on [employer] and ER strengthening as well to promote proper intraarticular mechanics at her R shoulder. Pt tolerated session well without aggravation of symptoms. Pt will benefit from continued skilled physical therapy services to improve joint mobility, strength, AROM, and function.    Personal Factors and Comorbidities Comorbidity 1;Time since onset of injury/illness/exacerbation;Profession    Comorbidities R wrist fx    Examination-Activity Limitations Bathing;Reach Overhead;Dressing;Hygiene/Grooming;Toileting;Lift;Carry    Stability/Clinical Decision Making Stable/Uncomplicated    Rehab Potential Fair    PT Frequency 2x / week    PT Duration 3 weeks    PT Treatment/Interventions Therapeutic activities;Therapeutic exercise;Neuromuscular re-education;Patient/family education;Manual techniques;Dry needling;Aquatic Therapy;Electrical Stimulation;Iontophoresis 55m/ml Dexamethasone;Ultrasound    PT Next Visit Plan scapular strengthening, thoracic extension, glenohumeral control, neural flossing, manual techniques, modalities PRN    Consulted and Agree with Plan of Care Patient           Patient will benefit from skilled  therapeutic intervention in order to improve the following deficits and impairments:  Pain, Improper body mechanics, Decreased mobility, Increased muscle spasms, Postural dysfunction, Decreased activity tolerance, Decreased endurance, Decreased range of motion, Decreased strength, Hypomobility, Difficulty walking  Visit Diagnosis: Muscle weakness (generalized)  Radiculopathy, cervicothoracic region  Acute pain of right shoulder     Problem List There are no problems to display for this patient.  MJoneen BoersPT, DPT   03/08/2020, 6:28 PM  [place] [address] [place] [address]Phone: [phone]  Fax:  [phone] Name: Veronica EllingMRN: [id number] of Birth: 207/07/1961

## 2020-03-09 ENCOUNTER — Ambulatory Visit: Payer: BC Managed Care – PPO | Admitting: Occupational Therapy

## 2020-03-13 ENCOUNTER — Other Ambulatory Visit: Payer: Self-pay

## 2020-03-13 ENCOUNTER — Ambulatory Visit: Payer: BC Managed Care – PPO

## 2020-03-13 DIAGNOSIS — M6281 Muscle weakness (generalized): Secondary | ICD-10-CM | POA: Diagnosis not present

## 2020-03-13 DIAGNOSIS — M5413 Radiculopathy, cervicothoracic region: Secondary | ICD-10-CM

## 2020-03-13 DIAGNOSIS — M25511 Pain in right shoulder: Secondary | ICD-10-CM

## 2020-03-13 NOTE — Patient Instructions (Signed)
With 3 lbs around distal arm             Functional IR R shoulder 10x3  Reviewed and given as part of her HEP. Pt demonstrated and verbalized understanding.

## 2020-03-13 NOTE — Therapy (Signed)
[facility] [address] [place], [address] Phone: [phone]   Fax:  [phone]  Physical Therapy Treatment  Patient Details  Name: Veronica Anthony MRN: [id number] Date of Birth: [birth year] Referring Provider (PT): Duanne Guess, [place]   Encounter Date: 03/13/2020   PT End of Session - 03/13/20 1735    Visit Number 14    Number of Visits 20    Date for PT Re-Evaluation 04/20/20    PT Start Time 4496    PT Stop Time 1810    PT Time Calculation (min) 33 min    Activity Tolerance Patient tolerated treatment well    Behavior During Therapy [facility] for tasks assessed/performed           Past Medical History:  Diagnosis Date  . Complication of anesthesia    vomiting   . GERD (gastroesophageal reflux disease)   . History of kidney stones    2015  . Sleep apnea    2011     Past Surgical History:  Procedure Laterality Date  . OPEN REDUCTION INTERNAL FIXATION (ORIF) DISTAL RADIAL FRACTURE Right 11/04/2019   Procedure: OPEN REDUCTION INTERNAL FIXATION (ORIF) DISTAL RADIAL FRACTURE;  Surgeon: Hessie Knows, MD;  Location: [facility] ORS;  Service: Orthopedics;  Laterality: Right;  . TUBAL LIGATION  1991    There were no vitals filed for this visit.   Subjective Assessment - 03/13/20 1738    Subjective R shoulder is fine, no pain currently. Next MD appointment is either July 17 or 19, 2021. Will make a decision whether or not to continue PT in the clinic after that session.    Pertinent History R shoulder pain. Pain in R shoulder began after her R hand surgery on 11/04/2019. Pt would feel pain in his R anterior lateral forearm, biceps area, and poisterior shoulder. Pt uses ice in her R upper arm helps relax the muscles. Used to feelt a muscle tension R anterior latarl arm which is becoming smaller and smaller. Feels pain when reaching towards the side and up. Worst pain is when reaching up.  Bones hurt at night. R  wrist fracture is almost completely healed. R UE pain has gotten a little better. However it continues to bother her at night when she sleeps on her R or L sides.    Patient Stated Goals No pain, when sleepin either on her R or L sides.    Currently in Pain? No/denies    Pain Score 0-No pain    Pain Onset More than a month ago              [facility] PT Assessment - 03/13/20 1804      Strength   Right Shoulder Flexion 5/5    Right Shoulder ABduction 4+/5    Right Shoulder Internal Rotation 5/5    Right Shoulder External Rotation 4+/5                                 PT Education - 03/13/20 1808    Education Details ther-ex, HEP    Person(s) Educated Patient    Methods Explanation;Demonstration;Tactile cues;Verbal cues    Comprehension Returned demonstration;Verbalized understanding           Objective  No latex band allergies  No pain with cooking, cleaning. However, when she stops doing her daily activities, she has pain.      R  wrist fracture, mostly healed per pt  Interpreter:Marta Col   MedbridgeAccess Code [email or web address]    Manual therapy    seated STM R infraspinatus Slight improved R shoulder functional IR observed    Therapeutic exercise    Standing R shoulder AROM  AROM R shoulder: Flexion  140 degrees, abduction 131 degrees  Functional R shoulder IR  R thumb to around L5  With 3 lbs around distal arm             Functional IR R shoulder 10x3  Reviewed and given as part of her HEP. Pt demonstrated and verbalized understanding.              Manually resisted R shoulder flexion, abduction, ER, IR   Improved R shoulder strength all planes compared to initial measurements  Reviewed progress/current status with PT towards goals    Reviewed plan of care: continue with HEP for the next 5 weeks and return for a follow up appointment.     Improved exercise technique, movement at target  joints, use of target muscles after mod verbal, visual, tactile cues.    Response to treatment Pt tolerated session well without aggravation of symptoms.   Clinical impression  Pt demonstrates decreased R shoulder and UE pain, improved R shoulder strength all planes, improved R shoulder flexion and abduction AROM, as well as improved ability to perform functional tasks since initial evaluation. Pt making very good progress with PT towards goals. Pt to continue with HEP and follow up in 5 weeks to assess ability to maintain progress with her home program.         PT Short Term Goals - 03/13/20 1819      PT SHORT TERM GOAL #1   Title Pt will be independent with her HEP to decrease pain, improve AROM and ability to reach without pain.    Baseline Patient tries, had to stop due to [facility] went out and was too hot at home (02/28/2020); Pt independent with her HEP (03/13/2020)    Time 3    Period Weeks    Status Achieved    Target Date 02/10/20             PT Long Term Goals - 03/13/20 1738      PT LONG TERM GOAL #1   Title Patient will have a decrease in R shoulder and UE pain to 2/10 or less at worst to promote ability to raise her arm up as well as reach more comfortably.    Baseline 5/10 R shoulder pain at most for the past 2 months (01/17/2020); 3/10 at worst for the past 7 days (02/28/2020); 2-3/10 R shoulder pain at most for the past 7 days (03/13/2020)    Time 5    Period Weeks    Status Partially Met    Target Date 04/20/20      PT LONG TERM GOAL #2   Title Patient will improve R shoulder strength by at least 1/2 MMT grade to promote ability to perform tasks with her R UE with less difficulty.    Time 3    Period Weeks    Status Achieved    Target Date 03/16/20      PT LONG TERM GOAL #3   Title Pt will improve her shoulder FOTO score by at least 10 points as a demonstration of improved function.    Baseline shoulder FOTO: 38 (01/17/2020); 62 (02/28/2020)    Time 6     Period Weeks  Status Achieved      PT LONG TERM GOAL #4   Title Patient will improve R shoulder flexion and abduction AROM by at least 15 degrees to promote ability to reach with less difficulty.    Baseline AROM R shoulder: flexion 133 degrees, abduction 77 degrees (01/17/2020); Flexion: 130 degrees, abduction 131 degrees (02/28/2020);AROM R shoulder: Flexion  140 degrees, abduction 131 degrees (03/13/2020)    Time 5    Period Weeks    Status Partially Met    Target Date 04/20/20                 Plan - 03/13/20 1815    Clinical Impression Statement Pt demonstrates decreased R shoulder and UE pain, improved R shoulder strength all planes, improved R shoulder flexion and abduction AROM, as well as improved ability to perform functional tasks since initial evaluation. Pt making very good progress with PT towards goals. Pt to continue with HEP and follow up in 5 weeks to assess ability to maintain progress with her home program.    Personal Factors and Comorbidities Comorbidity 1;Time since onset of injury/illness/exacerbation;Profession    Comorbidities R wrist fx    Examination-Activity Limitations Bathing;Reach Overhead;Dressing;Hygiene/Grooming;Toileting;Lift;Carry    Stability/Clinical Decision Making Stable/Uncomplicated    Clinical Decision Making Low    Rehab Potential Fair    PT Frequency One time visit    PT Duration --   in 5 weeks   PT Treatment/Interventions Therapeutic activities;Therapeutic exercise;Neuromuscular re-education;Patient/family education;Manual techniques;Dry needling;Aquatic Therapy;Electrical Stimulation;Iontophoresis 66m/ml Dexamethasone;Ultrasound    PT Next Visit Plan scapular strengthening, thoracic extension, glenohumeral control, neural flossing, manual techniques, modalities PRN    PT Home Exercise Plan Medbridge Access Code [email or web address]    Consulted and Agree with Plan of Care Patient           Patient will benefit from skilled therapeutic  intervention in order to improve the following deficits and impairments:  Pain, Improper body mechanics, Decreased mobility, Increased muscle spasms, Postural dysfunction, Decreased activity tolerance, Decreased endurance, Decreased range of motion, Decreased strength, Hypomobility, Difficulty walking  Visit Diagnosis: Muscle weakness (generalized) - Plan: PT plan of care cert/re-cert  Radiculopathy, cervicothoracic region - Plan: PT plan of care cert/re-cert  Acute pain of right shoulder - Plan: PT plan of care cert/re-cert     Problem List There are no problems to display for this patient.   MJoneen BoersPT, DPT    03/13/2020, 6:29 PM  [facility] [address] [place] [address]Phone: [phone]  Fax:  [phone] Name: EShardea CwynarMRN: [id number] of Birth: [birth year]

## 2020-03-14 ENCOUNTER — Ambulatory Visit: Payer: BC Managed Care – PPO | Admitting: Occupational Therapy

## 2020-03-14 DIAGNOSIS — M25631 Stiffness of right wrist, not elsewhere classified: Secondary | ICD-10-CM

## 2020-03-14 DIAGNOSIS — M6281 Muscle weakness (generalized): Secondary | ICD-10-CM

## 2020-03-14 NOTE — Therapy (Signed)
[facility] [address], [place], [address] Phone: [phone]   Fax:  [phone]  Occupational Therapy Evaluation  Patient Details  Name: Veronica Anthony MRN: [id number] Date of Birth: [birth year] No data recorded  Encounter Date: 03/14/2020   OT End of Session - 03/14/20 1644    Visit Number 18    Number of Visits 18    Date for OT Re-Evaluation 03/14/20    OT Start Time 1600    OT Stop Time 1629    OT Time Calculation (min) 29 min    Activity Tolerance Patient tolerated treatment well    Behavior During Therapy [facility] for tasks assessed/performed           Past Medical History:  Diagnosis Date   Complication of anesthesia    vomiting    GERD (gastroesophageal reflux disease)    History of kidney stones    2015   Sleep apnea    2011     Past Surgical History:  Procedure Laterality Date   OPEN REDUCTION INTERNAL FIXATION (ORIF) DISTAL RADIAL FRACTURE Right 11/04/2019   Procedure: OPEN REDUCTION INTERNAL FIXATION (ORIF) DISTAL RADIAL FRACTURE;  Surgeon: Kennedy Bucker, MD;  Location: [facility] ORS;  Service: Orthopedics;  Laterality: Right;   TUBAL LIGATION  1991    There were no vitals filed for this visit.   Subjective Assessment - 03/14/20 1637    Subjective  I am back to work just over 3 wks and doing very well - I am using my hand in everything - my shoulder is much better the only thing I cannot do each reach behind back    Pertinent History Pt fell on 2/10 - close reduction in ER was done - but then needed -  ORIF of the right distal radius. Date of surgery was 11/03/2018 - refer to hand therap because of stiffness and scar massage    Patient Stated Goals I want to be able to use my R dominant hand and wrist so I can go back to work , work in yard, drive , cook and ride bike    Currently in Pain? No/denies             [facility] OT Assessment - 03/14/20 0001      AROM   Right Wrist  Extension 65 Degrees    Right Wrist Flexion 40 Degrees   50 after stretch     Strength   Right Hand Grip (lbs) 35    Right Hand Lateral Pinch 15 lbs    Right Hand 3 Point Pinch 15 lbs    Left Hand Grip (lbs) 54    Left Hand Lateral Pinch 16 lbs    Left Hand 3 Point Pinch 17 lbs      Right Hand AROM   R Index  MCP 0-90 85 Degrees    R Index PIP 0-100 100 Degrees    R Long  MCP 0-90 90 Degrees    R Long PIP 0-100 100 Degrees    R Ring  MCP 0-90 90 Degrees    R Ring PIP 0-100 100 Degrees    R Little  MCP 0-90 85 Degrees    R Little PIP 0-100 100 Degrees          Pt for check up with OT after not seen for month Pt continued at home with HEP and returned to work about 3 to 4 wks ago Report she can do everything  at home and most everything at work  Still favor some things at work - but not because of L hand hand and wrist   Pt showed great progress in AROM for digits  Strength in wrist 5/5  And grip and prehension improved greatly  Pt do show decrease L wrist flexion 40 degrees  reviewed with pt HEP to increase wrist flexion stretch - hold 2 min at time - x 2 every day  Pt did increase to 50% with stretch for 1 min and then 2 min                     OT Education - 03/14/20 1644    Education Details discharge instructions ,progress    Person(s) Educated Patient    Methods Explanation;Demonstration;Tactile cues;Verbal cues;Handout    Comprehension Returned demonstration;Verbalized understanding;Verbal cues required            OT Short Term Goals - 03/14/20 1648      OT SHORT TERM GOAL #1   Title Pt to be Ind in HEP to increase digits AROM in flexion and extention to WNL    Status Achieved      OT SHORT TERM GOAL #2   Title Pt to be ind in HEP to increase AROM in R wrist to be able to wean out of splint and use hand in ADL's    Status Achieved             OT Long Term Goals - 03/14/20 1648      OT LONG TERM GOAL #1   Title Pt AROM in R wrist  increase in all planes more tan 75% compare to R wrist to use hand in more than 75% of function on PRWHE    Status Achieved      OT LONG TERM GOAL #2   Title R grip and prehension strenght increase to more than 60% compare to L to cut food, hold plate and carry more than 5 lbs    Status Achieved      OT LONG TERM GOAL #3   Title Function on PRWHE improve with more than 30 points    Status Achieved                 Plan - 03/14/20 1645    Clinical Impression Statement Pt is more than 16 wks s/p R ORIF distal radius fx - pt was seen month ago -while working on [employer] and returned back to work - had great progress in strength, grip and prehension - as well as composite fist and wrist. Wrist flexion is the only one that is 40 degrees and R side if 90 - review with pt again stretch for flexion to do 2 x day - 2 min at time couple of times - cont at home and follow up with surgeon in 2-3 wks    OT Occupational Profile and History Problem Focused Assessment - Including review of records relating to presenting problem    Occupational performance deficits (Please refer to evaluation for details): ADL's;IADL's;Play;Leisure;Work;Social Participation    Body Structure / Function / Physical Skills ADL;Flexibility;ROM;UE functional use;Decreased knowledge of precautions;FMC;Scar mobility;Edema;Pain;IADL;Strength    Rehab Potential Good    Clinical Decision Making Limited treatment options, no task modification necessary    Plan cont at home with wrist flexion stetch and follow up with surgeon    OT Home Exercise Plan see pt instruction    Consulted and Agree with Plan of Care  Patient           Patient will benefit from skilled therapeutic intervention in order to improve the following deficits and impairments:   Body Structure / Function / Physical Skills: ADL, Flexibility, ROM, UE functional use, Decreased knowledge of precautions, FMC, Scar mobility, Edema, Pain, IADL, Strength       Visit  Diagnosis: Stiffness of right wrist, not elsewhere classified - Plan: Ot plan of care cert/re-cert  Muscle weakness (generalized) - Plan: Ot plan of care cert/re-cert    Problem List There are no problems to display for this patient.   Oletta Cohn OTR/L,CLT  03/14/2020, 4:50 PM  [facility] PH[facility] SPORTS MEDIC[facility] [address], [place], [address] Phone: [phone]   Fax:  [phone]  Name: Earlean Fidalgo MRN: [id number] Date of Birth: 02-16-1961

## 2020-04-19 ENCOUNTER — Ambulatory Visit: Payer: BC Managed Care – PPO | Attending: Orthopedic Surgery

## 2020-04-25 ENCOUNTER — Other Ambulatory Visit: Payer: Self-pay

## 2020-04-25 ENCOUNTER — Ambulatory Visit: Payer: BC Managed Care – PPO

## 2020-05-25 ENCOUNTER — Other Ambulatory Visit: Payer: Self-pay | Admitting: Primary Care

## 2020-05-25 DIAGNOSIS — Z1231 Encounter for screening mammogram for malignant neoplasm of breast: Secondary | ICD-10-CM

## 2020-06-12 ENCOUNTER — Ambulatory Visit
Admission: RE | Admit: 2020-06-12 | Discharge: 2020-06-12 | Disposition: A | Discharge status: Home / Self Care | Payer: BC Managed Care – PPO | Source: Ambulatory Visit | Attending: Primary Care | Admitting: Primary Care

## 2020-06-12 ENCOUNTER — Other Ambulatory Visit: Payer: Self-pay

## 2020-06-12 DIAGNOSIS — Z1231 Encounter for screening mammogram for malignant neoplasm of breast: Secondary | ICD-10-CM | POA: Insufficient documentation

## 2021-04-16 DEATH — deceased

## 2022-05-23 IMAGING — MG DIGITAL SCREENING BILAT W/ TOMO W/ CAD
8 series · 8 of 24 positions shown · non-contrast
Comparison: Previous exam(s).

CLINICAL DATA: Screening.

EXAM:
DIGITAL SCREENING BILATERAL MAMMOGRAM WITH TOMO AND CAD

[R CC synth-2D]
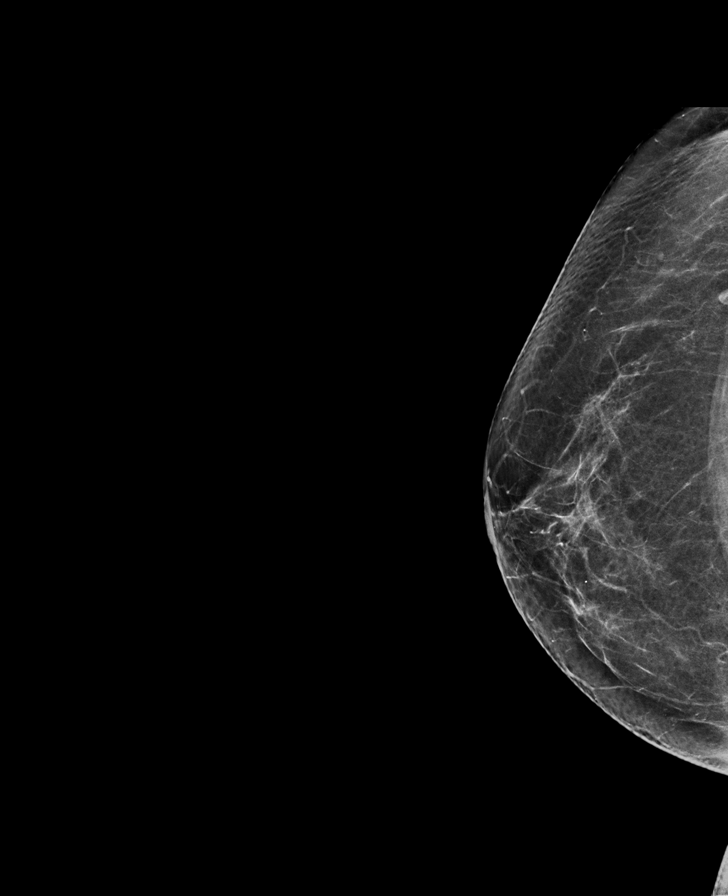

[L CC synth-2D]
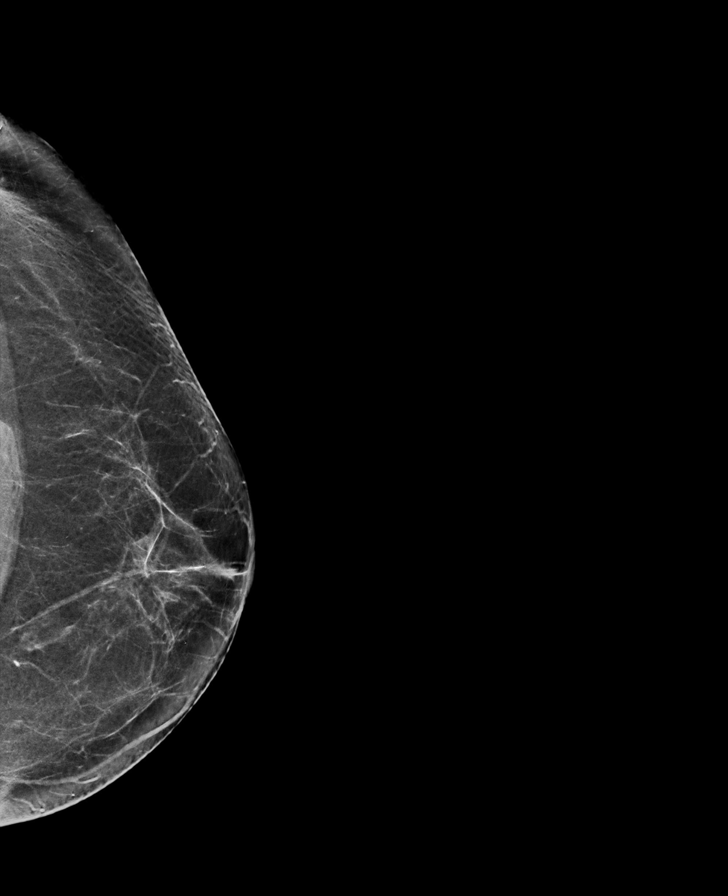

[L MLO synth-2D]
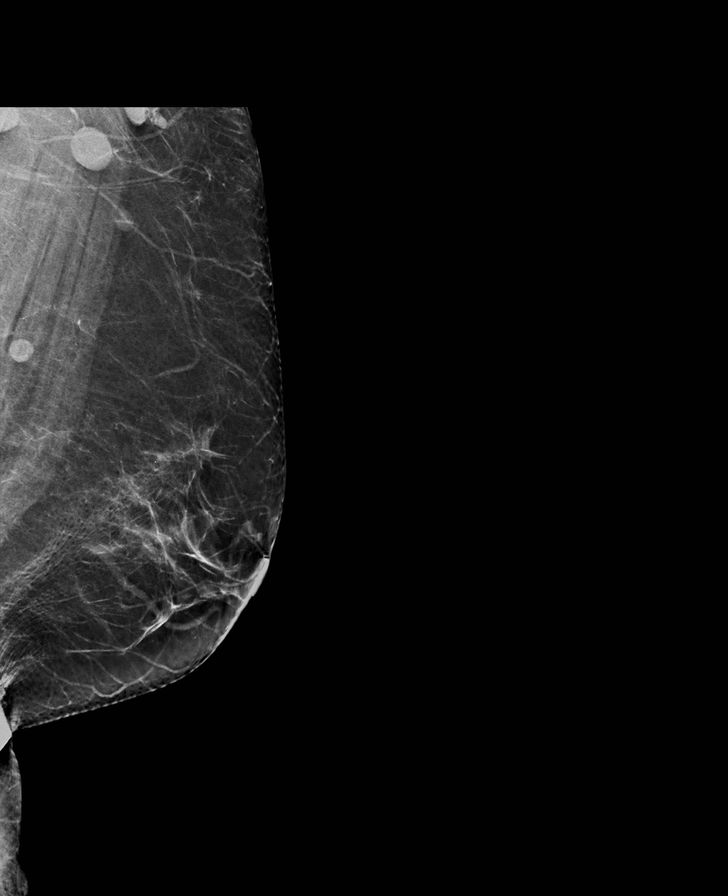

[R MLO synth-2D]
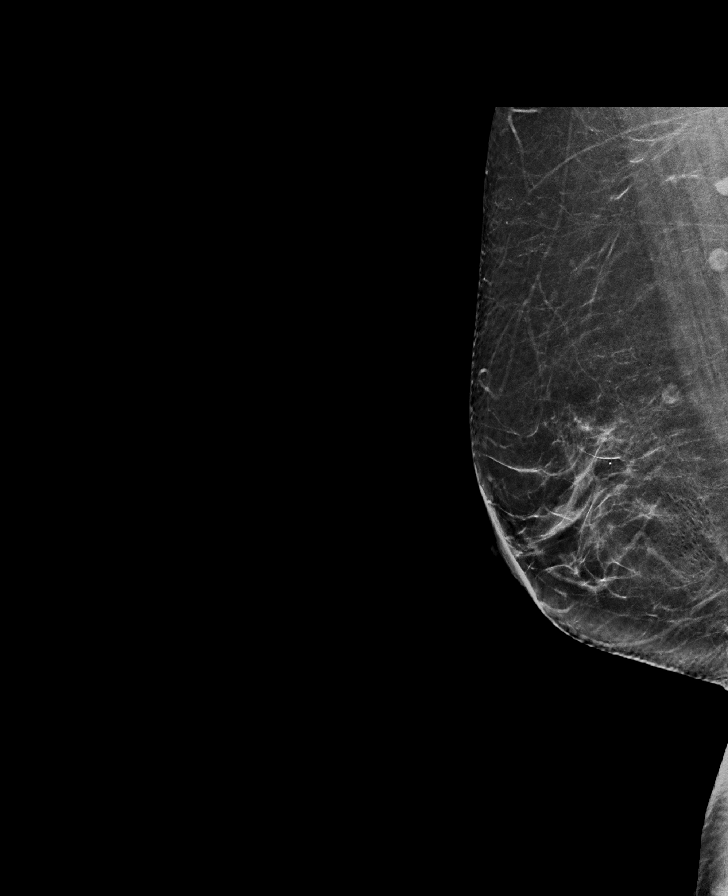

[R CC tomo · tomo slice 35/69.0]
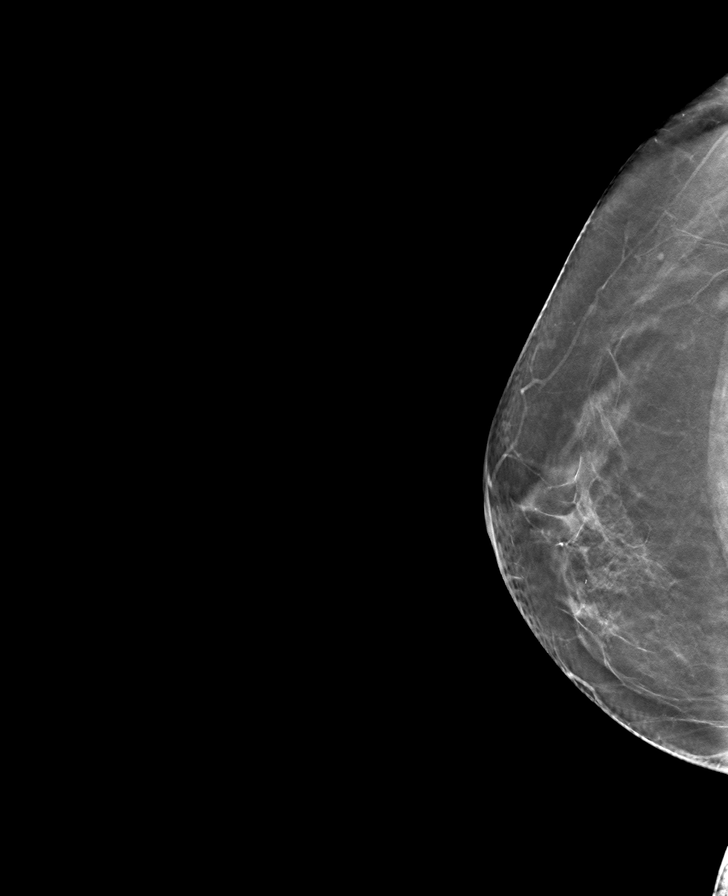

[L MLO tomo · tomo slice 37/74.0]
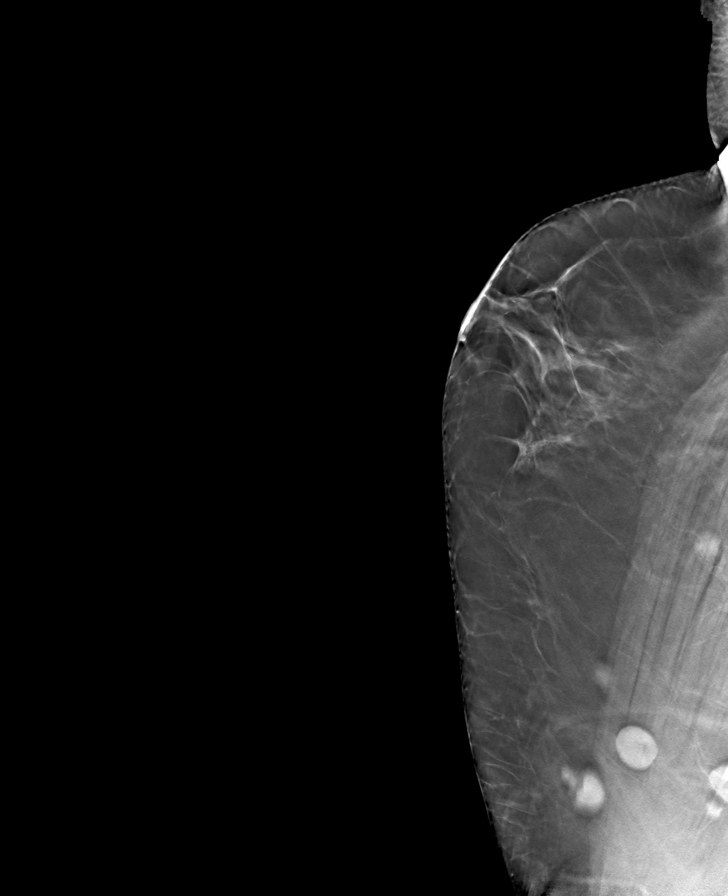

[L CC tomo · tomo slice 35/69.0]
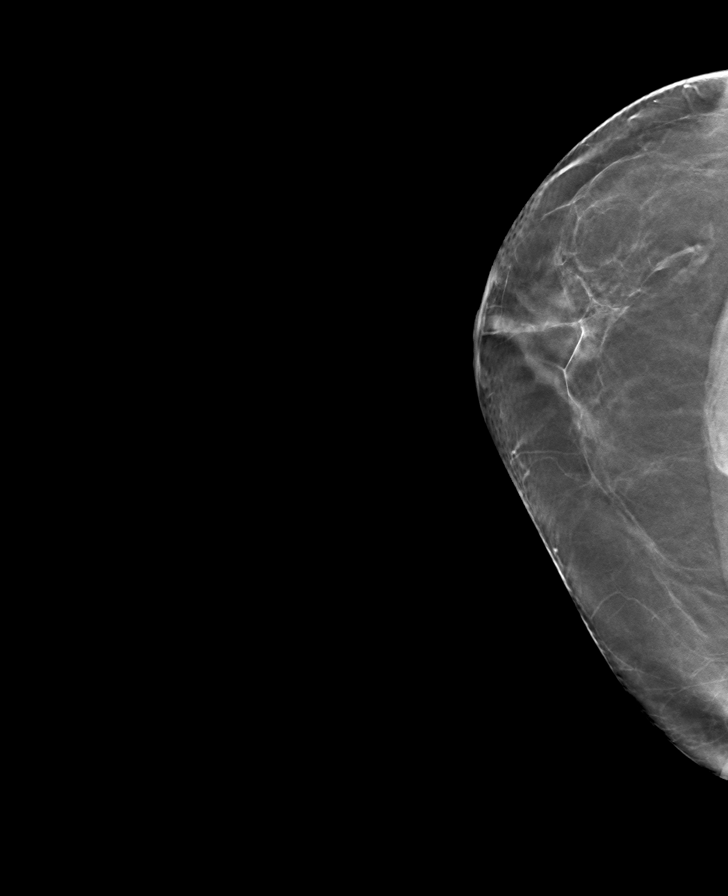

[R MLO tomo · tomo slice 37/73.0]
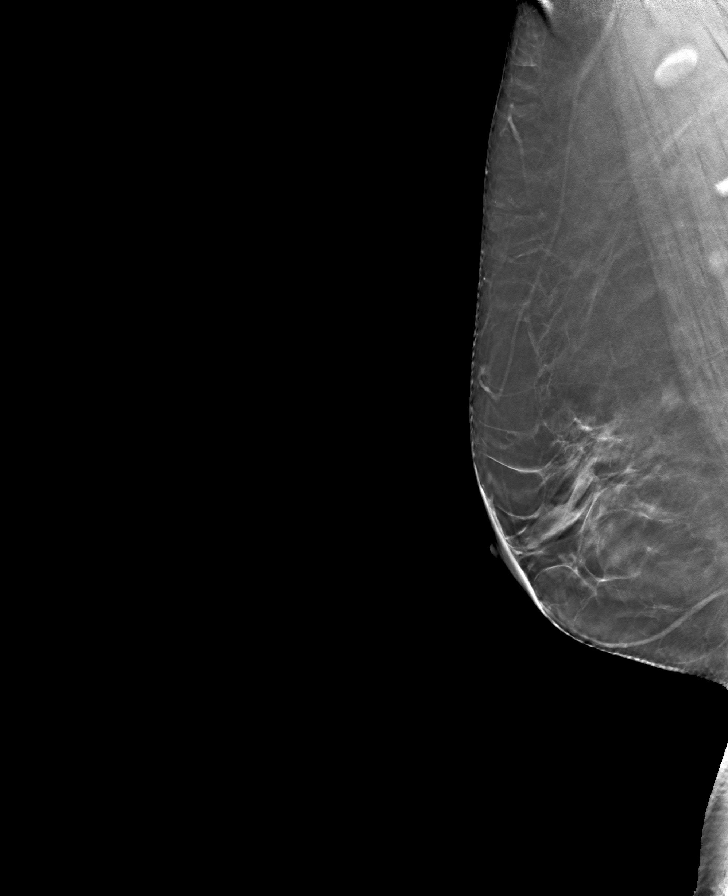

[8 of 24 positions shown; findings below may reference images not displayed]

ACR Breast Density Category b: There are scattered areas of
fibroglandular density.
FINDINGS: There are no findings suspicious for malignancy. Images were
processed with CAD.
IMPRESSION: No mammographic evidence of malignancy. A result letter of this
screening mammogram will be mailed directly to the patient.

RECOMMENDATION:
Screening mammogram in one year. (Code:[id number])

BI-RADS CATEGORY  1: Negative.

## 2024-04-01 ENCOUNTER — Ambulatory Visit
Admission: RE | Admit: 2024-04-01 | Discharge: 2024-04-01 | Disposition: A | Discharge status: Home / Self Care | Source: Ambulatory Visit | Attending: Medical | Admitting: Medical

## 2024-04-01 ENCOUNTER — Other Ambulatory Visit: Payer: Self-pay | Admitting: Medical

## 2024-04-01 ENCOUNTER — Ambulatory Visit (INDEPENDENT_AMBULATORY_CARE_PROVIDER_SITE_OTHER): Payer: Self-pay | Admitting: Medical

## 2024-04-01 ENCOUNTER — Other Ambulatory Visit: Payer: Self-pay

## 2024-04-01 ENCOUNTER — Ambulatory Visit
Admission: RE | Admit: 2024-04-01 | Discharge: 2024-04-01 | Disposition: A | Discharge status: Home / Self Care | Source: Ambulatory Visit | Attending: Medical

## 2024-04-01 ENCOUNTER — Encounter: Payer: Self-pay | Admitting: Medical

## 2024-04-01 VITALS — BP 134/66 | HR 70 | Temp 98.3°F | Ht 64.0 in | Wt 159.0 lb

## 2024-04-01 DIAGNOSIS — S20212A Contusion of left front wall of thorax, initial encounter: Secondary | ICD-10-CM

## 2024-04-01 NOTE — Addendum Note (Signed)
 Addended by: TOWANA JOEN HERO on: 04/01/2024 08:49 AM   Modules accepted: Orders

## 2024-04-01 NOTE — Progress Notes (Signed)
 [facility] [address]  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 She hit her left frontal chest which caused pain/bruising.   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 Took a short break after injury, applied ice to painful area for a few minutes. Was able to continue working after injury occurred, completed the shift.  Iced again at home. Took Tylenol  last night and today, which was helpful. Has minimal pain at rest but increased 6-7/10 when breathes deeply or with certain movement (i.e. pushing against something with her arms or lifting left arm above head). Noted bruising to chest wall last night and this morning. No swelling. Denies shortness of breath. No other injuries noted.   On exam, patient in no acute distress. Mild-moderate bruising with linear pattern noted to left lateral breast/anterior upper chest. Patient moderately tender to palpation of underlying breast tissue/chest wall. Chest wall moves symmetrically. No swelling appreciated. No specific bony  tenderness/step-off deformities. Lungs clear to auscultation bilaterally. No respiratory distress. Heart with regular rate and rhythm with no murmur/rub/gallop. Pain reproduced with movement at left shoulder or upon taking deep breath.  Recommend chest xray to rule out rib fracture.  Diagnosis  Chest wall injury/contusion  Medications Prescribed  She may continue taking Tylenol  650 - 1000 mg every 4-6 hours as needed. She should not take more than 4000 mg in 24 hours.   Referred to [facility] for chest xray to assess for possible rib fracture.    Return to Work Status Patient should remain out of work until reassessed on Monday, 04/05/24.   Provider Signature   ________________________________________Date_________   Employee Signature   _______________________________________Date_________   Please email this completed form to Berwyn Coach, Director of Risk Management at [email or web address] within 24 hours of visit.

## 2024-04-05 ENCOUNTER — Ambulatory Visit (INDEPENDENT_AMBULATORY_CARE_PROVIDER_SITE_OTHER): Payer: Self-pay | Admitting: Family Medicine

## 2024-04-05 ENCOUNTER — Encounter: Payer: Self-pay | Admitting: Family Medicine

## 2024-04-05 VITALS — BP 130/84 | HR 90 | Ht 64.0 in | Wt 159.0 lb

## 2024-04-05 DIAGNOSIS — S20212D Contusion of left front wall of thorax, subsequent encounter: Secondary | ICD-10-CM

## 2024-04-05 DIAGNOSIS — S20212A Contusion of left front wall of thorax, initial encounter: Secondary | ICD-10-CM

## 2024-04-05 NOTE — Progress Notes (Signed)
   Assessment & Plan:  Patient ID: Veronica Anthony, female    DOB: October 06, 1960  Age: 63 y.o. MRN: [id number]  Problem List Items Addressed This Visit   None Visit Diagnoses       Chest wall contusion, left, initial encounter    -  Primary     Rib contusion, left, subsequent encounter           I am having Veronica Anthony maintain her acetaminophen . She can add on otc ibuprofen as needed. Precautions given to  go to ER if worsening CP and/or SOB. Otherwise she will return here in 1 week , we will likely release her from care at that time for her contusion. However if symptoms persist then will consider designated rib xrays and PA/lateral chest xray or get CT scan of the ribs/chest. I looked at the chest xray with the patient, prelim results do not show any acute pneumthroax or fx but will await for official results. Continue with current job duties as tolerated.     There are no discontinued medications.  Follow-up: Return in about 1 week (around 04/12/2024) for rib contusion and if no improvement will get designated rib xrays .    Subjective:    CC: The primary encounter diagnosis was Chest wall contusion, left, initial encounter. A diagnosis of Rib contusion, left, subsequent encounter was also pertinent to this visit.  HPI Veronica Anthony presents for left chest wall contusion DOI 03/31/24 at work  MOA: pt lost balance and hit her left anterior chest wall, below the bra line   Today she is feeling better, able to do all her work functions,  at times it hurts when she lifts her arm or when she lifts something from a bent position, she does have some sporadic pain with deep breaths. At most 6/10 pain, taking tylenol , using ice. Has some bruising on the elbow area but no issues with ROM or strength.   History Veronica Anthony has a past medical history of Complication of anesthesia, GERD (gastroesophageal reflux disease), History of kidney stones, and Sleep apnea.   She has a past surgical  history that includes Tubal ligation (1991) and Open reduction internal fixation (orif) distal radial fracture (Right, 11/04/2019).   Her family history is not on file.She reports that she has never smoked. She has never used smokeless tobacco. She reports that she does not currently use alcohol. She reports that she does not use drugs.  Outpatient Medications Prior to Visit  Medication Sig Dispense Refill   acetaminophen  (TYLENOL ) 325 MG tablet Take 650 mg by mouth every 6 (six) hours as needed.     No facility-administered medications prior to visit.    Review of Systems  Objective:  BP 130/84   Pulse 90   Ht 5' 4 (1.626 m)   Wt 159 lb (72.1 kg)   SpO2 97%   BMI 27.29 kg/m  O2 Sat RA 98%   Physical Exam GEN: A&O x 3, NAD, well nourished HEENT: Normocephalic, atraumatic, EOMI, OP patent, bilateral TMs normal, no LAD, no thyromegaly RESP: CTAB, no R/R/W CV: RRR, no M/R/G ABDOMEN: soft, nontender, normal BS, no HSM VAS: no Veronica Anthony swelling NEURO: A&O x 3, normal gait, normal ROM UE and Veronica Anthony PSYCH: normal speech, normal mood SKIN: ecchymosis at left medial elbow.  MSK: full ROM of left elbow, 5/5 strength, nontender at the elbow. She is mildly tender at the left anterolateral aspect of her braline.    Veronica Anthony Veronica Zahraa Bhargava, DO

## 2024-04-12 ENCOUNTER — Ambulatory Visit: Payer: Self-pay | Admitting: Medical

## 2024-04-12 ENCOUNTER — Ambulatory Visit (INDEPENDENT_AMBULATORY_CARE_PROVIDER_SITE_OTHER): Payer: Self-pay | Admitting: Medical

## 2024-04-12 ENCOUNTER — Encounter: Payer: Self-pay | Admitting: Medical

## 2024-04-12 VITALS — BP 122/72 | HR 65

## 2024-04-12 DIAGNOSIS — S20212D Contusion of left front wall of thorax, subsequent encounter: Secondary | ICD-10-CM

## 2024-04-12 DIAGNOSIS — S20212A Contusion of left front wall of thorax, initial encounter: Secondary | ICD-10-CM

## 2024-04-12 NOTE — Progress Notes (Unsigned)
 [facility] 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 She hit her left frontal chest which caused pain/bruising.    Provider Note Patient seen today for follow up of chest wall contusion sustained 12 days ago. See prior notes for additional hx.  Patient reports improvement to pain, more localized to left lower anterior chest wall. Has been taking Tylenol  about once a day. No longer has pain with cough. Does occasionally experience pain with deep breath or when she rolls over at night. Was able to work last week. 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  Continues to note some bruising to left elbow but this does not bother her or limit activity.  Chest xray done 04/01/24 was negative for acute fracture. Dedicated rib imaging not performed.  Physical Exam Vitals reviewed.  Constitutional:      General: She is not in acute distress.    Appearance: She is not ill-appearing.  Chest:     Chest wall: Tenderness (mild to left anterior lower chest wall) present. No deformity or swelling.     Comments: Bruising resolved. Skin:    Comments: Mild bruising to medial left elbow. No  swelling, tenderness or decreased ROM to left elbow.  Neurological:     Mental Status: She is alert.      Diagnosis  Chest wall contusion, left, initial encounter  Rib contusion, left, subsequent encounter   Medications Prescribed  No orders of the defined types were placed in this encounter.   Referred to N/A  Reviewed negative chest xray result with patient.   Return to Work Status Continue normal duties as tolerated. Discussed trying Ibuprofen at night or after work as needed. Also discussed OTC Voltaren/Diclofenac gel to tender area if needed. Expect pain will continue to resolve over next 7 days. Patient will follow up as needed with new/worsening/persistent symptoms.  Time spent in direct care of patient and documenting visit: 20 minutes  Joen Towana RIGGERS Physician Assistant

## 2024-04-13 NOTE — Patient Instructions (Signed)
-  Try Ibuprofen after work or at bedtime if needed for pain or try using Voltaren/Diclofenac gel on the painful area. -Schedule return visit as needed for new/worsening symptoms or if symptoms do not continue to improve as discussed.
